# Patient Record
Sex: Female | Born: 1960 | Race: White | Hispanic: No | Smoking: Never smoker
Health system: Southern US, Community
[De-identification: ages and names within clinical notes are randomized; demographics above are authoritative.]

## PROBLEM LIST (undated history)

## (undated) DIAGNOSIS — E119 Type 2 diabetes mellitus without complications: Secondary | ICD-10-CM

## (undated) DIAGNOSIS — M5430 Sciatica, unspecified side: Secondary | ICD-10-CM

## (undated) DIAGNOSIS — N200 Calculus of kidney: Secondary | ICD-10-CM

## (undated) DIAGNOSIS — M549 Dorsalgia, unspecified: Secondary | ICD-10-CM

## (undated) DIAGNOSIS — G5600 Carpal tunnel syndrome, unspecified upper limb: Secondary | ICD-10-CM

## (undated) DIAGNOSIS — K08139 Complete loss of teeth due to caries, unspecified class: Secondary | ICD-10-CM

## (undated) DIAGNOSIS — I10 Essential (primary) hypertension: Secondary | ICD-10-CM

## (undated) DIAGNOSIS — K219 Gastro-esophageal reflux disease without esophagitis: Secondary | ICD-10-CM

## (undated) DIAGNOSIS — I4891 Unspecified atrial fibrillation: Secondary | ICD-10-CM

## (undated) DIAGNOSIS — K7460 Unspecified cirrhosis of liver: Secondary | ICD-10-CM

## (undated) DIAGNOSIS — E079 Disorder of thyroid, unspecified: Secondary | ICD-10-CM

## (undated) HISTORY — PX: TONSILLECTOMY: SUR1361

## (undated) HISTORY — PX: THYROIDECTOMY: SHX17

## (undated) HISTORY — PX: APPENDECTOMY: SHX54

## (undated) HISTORY — PX: TUBAL LIGATION: SHX77

## (undated) HISTORY — PX: BREAST SURGERY: SHX581

---

## 2010-09-21 ENCOUNTER — Emergency Department (HOSPITAL_BASED_OUTPATIENT_CLINIC_OR_DEPARTMENT_OTHER)
Admission: EM | Admit: 2010-09-21 | Discharge: 2010-09-21 | Disposition: A | Discharge status: Other Acute Inpatient Hospital | Payer: Self-pay | Source: Home / Self Care | Admitting: Emergency Medicine

## 2010-09-30 LAB — DIFFERENTIAL
Basophils Absolute: 0 10*3/uL (ref 0.0–0.1)
Basophils Relative: 0 % (ref 0–1)
Eosinophils Absolute: 0.4 10*3/uL (ref 0.0–0.7)
Eosinophils Relative: 5 % (ref 0–5)
Lymphocytes Relative: 30 % (ref 12–46)
Lymphs Abs: 2.2 10*3/uL (ref 0.7–4.0)
Monocytes Absolute: 0.7 10*3/uL (ref 0.1–1.0)
Monocytes Relative: 9 % (ref 3–12)
Neutro Abs: 4.2 10*3/uL (ref 1.7–7.7)
Neutrophils Relative %: 56 % (ref 43–77)

## 2010-09-30 LAB — CBC
HCT: 37.8 % (ref 36.0–46.0)
Hemoglobin: 13.3 g/dL (ref 12.0–15.0)
MCH: 28.4 pg (ref 26.0–34.0)
MCHC: 35.2 g/dL (ref 30.0–36.0)
MCV: 80.6 fL (ref 78.0–100.0)
Platelets: 178 10*3/uL (ref 150–400)
RBC: 4.69 MIL/uL (ref 3.87–5.11)
RDW: 13.2 % (ref 11.5–15.5)
WBC: 7.4 10*3/uL (ref 4.0–10.5)

## 2010-09-30 LAB — URINE CULTURE
Colony Count: NO GROWTH
Culture  Setup Time: 201201080028
Culture: NO GROWTH

## 2010-09-30 LAB — POCT CARDIAC MARKERS
CKMB, poc: 1.6 ng/mL (ref 1.0–8.0)
Myoglobin, poc: 90.2 ng/mL (ref 12–200)
Troponin i, poc: <0.05 ng/mL (ref 0.00–0.09)

## 2010-09-30 LAB — URINALYSIS, ROUTINE W REFLEX MICROSCOPIC
Bilirubin Urine: NEGATIVE
Hgb urine dipstick: NEGATIVE
Ketones, ur: NEGATIVE mg/dL
Nitrite: NEGATIVE
Protein, ur: NEGATIVE mg/dL
Specific Gravity, Urine: 1.015 (ref 1.005–1.030)
Urine Glucose, Fasting: NEGATIVE mg/dL
Urobilinogen, UA: 1 mg/dL (ref 0.0–1.0)
pH: 6.5 (ref 5.0–8.0)

## 2010-09-30 LAB — BASIC METABOLIC PANEL
BUN: 12 mg/dL (ref 6–23)
CO2: 25 mEq/L (ref 19–32)
Calcium: 9.3 mg/dL (ref 8.4–10.5)
Chloride: 107 mEq/L (ref 96–112)
Creatinine, Ser: 0.6 mg/dL (ref 0.4–1.2)
GFR calc Af Amer: >60 mL/min (ref >60)
GFR calc non Af Amer: >60 mL/min (ref >60)
Glucose, Bld: 98 mg/dL (ref 70–99)
Potassium: 4 mEq/L (ref 3.5–5.1)
Sodium: 146 mEq/L — ABNORMAL HIGH (ref 135–145)

## 2010-09-30 LAB — D-DIMER, QUANTITATIVE: D-Dimer, Quant: 0.24 ug/mL-FEU (ref 0.00–0.48)

## 2011-08-29 ENCOUNTER — Encounter: Payer: Self-pay | Admitting: *Deleted

## 2011-08-29 ENCOUNTER — Emergency Department (HOSPITAL_BASED_OUTPATIENT_CLINIC_OR_DEPARTMENT_OTHER)
Admission: EM | Admit: 2011-08-29 | Discharge: 2011-08-29 | Disposition: A | Discharge status: Home / Self Care | Payer: Medicaid Other | Attending: Emergency Medicine | Admitting: Emergency Medicine

## 2011-08-29 DIAGNOSIS — K219 Gastro-esophageal reflux disease without esophagitis: Secondary | ICD-10-CM | POA: Insufficient documentation

## 2011-08-29 DIAGNOSIS — I10 Essential (primary) hypertension: Secondary | ICD-10-CM | POA: Insufficient documentation

## 2011-08-29 DIAGNOSIS — I4891 Unspecified atrial fibrillation: Secondary | ICD-10-CM | POA: Insufficient documentation

## 2011-08-29 DIAGNOSIS — M79609 Pain in unspecified limb: Secondary | ICD-10-CM | POA: Insufficient documentation

## 2011-08-29 DIAGNOSIS — M79606 Pain in leg, unspecified: Secondary | ICD-10-CM

## 2011-08-29 HISTORY — DX: Gastro-esophageal reflux disease without esophagitis: K21.9

## 2011-08-29 HISTORY — DX: Dorsalgia, unspecified: M54.9

## 2011-08-29 HISTORY — DX: Unspecified atrial fibrillation: I48.91

## 2011-08-29 HISTORY — DX: Essential (primary) hypertension: I10

## 2011-08-29 MED ORDER — HYDROCODONE-ACETAMINOPHEN 5-325 MG PO TABS: ORAL_TABLET | ORAL | Status: DC

## 2011-08-29 MED ORDER — HYDROCODONE-ACETAMINOPHEN 5-325 MG PO TABS
2.0000 | ORAL_TABLET | Freq: Once | ORAL | Status: AC
  Administered 2011-08-29: 2 via ORAL
  Filled 2011-08-29: qty 2

## 2011-08-29 MED ORDER — PREDNISONE 20 MG PO TABS
40.0000 mg | ORAL_TABLET | Freq: Once | ORAL | Status: AC
  Administered 2011-08-29: 40 mg via ORAL
  Filled 2011-08-29: qty 2

## 2011-08-29 MED ORDER — PREDNISONE 20 MG PO TABS: 40.0000 mg | ORAL_TABLET | Freq: Every day | ORAL | Status: AC

## 2011-08-29 NOTE — ED Notes (Signed)
Pt c/o burning type pain in left lateral thigh that is tender to touch. No redness or swelling noted. Not warm to touch. Pt denies any known injury

## 2011-08-29 NOTE — Discharge Instructions (Signed)
 Narcotic and benzodiazepine use may cause drowsiness, slowed breathing or dependence.  Please use with caution and do not drive, operate machinery or watch young children alone while taking them.  Taking combinations of these medications or drinking alcohol will potentiate these effects.      Please call and follow up with your primary physician next week to make sure you are improving.  If pain suddenly worsens, you develop high fever, develop numbness or weakness worsening in your leg, or discoloration of your leg develops please return.

## 2011-08-29 NOTE — ED Notes (Signed)
Pt c/o left thigh pain x 2 days  

## 2011-08-29 NOTE — ED Provider Notes (Signed)
History     CSN: [id number] Arrival date & time: 08/29/2011  8:08 PM   First MD Initiated Contact with Patient 08/29/11 2108      Chief Complaint  Patient presents with  . Leg Pain    (Consider location/radiation/quality/duration/timing/severity/associated sxs/prior treatment) HPI Comments: Patient reports pain in the lateral thigh area of her left leg without any specific injury. She does take Coumadin. She has not noticed any significant swelling or bruising to that area. She denies any distal weakness or numbness. 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 She denies any foot drop, stumbling or weakness to her toes, ankle or foot. She does have Flexeril at home and has been taking it with limited improvement of her symptoms. She reports movement of her legs seems to make it worse lately. Otherwise she cannot specify any specific activity that makes the pain worse. She reports holding pressure to her thigh seems to improve the pain somewhat. She denies any fever or chills. She denies any chest pain or shortness of breath or pleurisy. No recent long distance travel. She denies any calf swelling or tenderness.  The history is provided by the patient.    Past Medical History  Diagnosis Date  . Atrial fibrillation   . Hypertension   . Back pain   . GERD (gastroesophageal reflux disease)     Past Surgical History  Procedure Date  . Cesarean section   . Tonsillectomy   . Appendectomy   . Breast surgery     History reviewed. No pertinent family history.  History  Substance Use Topics  . Smoking status: Never Smoker   . Smokeless tobacco: Not on file  . Alcohol Use: No    OB History    Grav Para Term Preterm Abortions TAB SAB Ect Mult Living                  Review of Systems  Constitutional: Negative.   HENT: Negative for neck pain.   Respiratory: Negative for cough and shortness of  breath.   Cardiovascular: Negative for chest pain.  Musculoskeletal: Negative for back pain and joint swelling.  Skin: Negative for color change, rash and wound.  Neurological: Negative for weakness and numbness.    Allergies  Motrin and Ultram  Home Medications   Current Outpatient Rx  Name Route Sig Dispense Refill  . CYCLOBENZAPRINE HCL 10 MG PO TABS Oral Take 10 mg by mouth 3 (three) times daily as needed. For muscle spasms     . DIGOXIN 0.25 MG PO TABS Oral Take 250 mcg by mouth daily.      Marland Kitchen ESOMEPRAZOLE MAGNESIUM 40 MG PO CPDR Oral Take 40 mg by mouth daily before breakfast.      . METOPROLOL SUCCINATE ER 50 MG PO TB24 Oral Take 50 mg by mouth 2 (two) times daily.      . WARFARIN SODIUM 5 MG PO TABS Oral Take 2.5-5 mg by mouth daily. Take 1/2 tab Monday and Friday and take 1 tab on Tuesday, Wednesday, Thursday, Saturday and Sunday     . HYDROCODONE-ACETAMINOPHEN 5-325 MG PO TABS  1-2 tablets po q 6 hours prn moderate or severe pain 20 tablet 0  . PREDNISONE 20 MG PO TABS Oral Take 2 tablets (40 mg total) by mouth daily. 10 tablet 0    BP 141/81  Pulse 102  Temp(Src) 97.8 F (36.6  C) (Oral)  Resp 16  Ht 5' (1.524 m)  Wt 212 lb (96.163 kg)  BMI 41.40 kg/m2  SpO2 100%  Physical Exam  Nursing note and vitals reviewed. Constitutional: She is oriented to person, place, and time. She appears well-developed and well-nourished.  HENT:  Head: Normocephalic and atraumatic.  Neck: Normal range of motion. Neck supple.  Cardiovascular: Normal rate and regular rhythm.   Abdominal: She exhibits no distension. There is no tenderness. There is no rebound and no guarding.  Neurological: She is alert and oriented to person, place, and time. She has normal strength. She displays no atrophy. No sensory deficit. She exhibits normal muscle tone.  Skin: No abrasion, no bruising, no ecchymosis and no rash noted. No erythema.    ED Course  Procedures (including critical care time)  Labs  Reviewed - No data to display No results found.   1. Leg pain, lateral       MDM    Patient has full range of motion, normal DP and PT pulses of the left leg. She has no significant asymmetric edema or swelling. She has no obvious lesions, rash to her left thigh area. She does not have a fever here on exam and her vital signs are otherwise within normal limits. She denies any pleuritic pain. She is a negative Homans sign on the left side. I suspect given the patient's history of a bulging disc, she may have somewhat of a radicular discomfort in that area. She has normal distal reflexes and strength. Patient is told to take analgesics, continue her Flexeril, now had prednisone for inflammation. She is told to followup with her primary care physician for close recheck to ensure that symptoms are resolving. Otherwise she is told that she may need referral to a back specialist if things are not improving.        Gavin Pound. Oletta Lamas, MD 08/29/11 2153

## 2011-10-28 ENCOUNTER — Encounter (HOSPITAL_BASED_OUTPATIENT_CLINIC_OR_DEPARTMENT_OTHER): Payer: Self-pay | Admitting: *Deleted

## 2011-10-28 ENCOUNTER — Emergency Department (HOSPITAL_BASED_OUTPATIENT_CLINIC_OR_DEPARTMENT_OTHER)
Admission: EM | Admit: 2011-10-28 | Discharge: 2011-10-28 | Disposition: A | Discharge status: Home / Self Care | Payer: Medicaid Other | Attending: Emergency Medicine | Admitting: Emergency Medicine

## 2011-10-28 ENCOUNTER — Emergency Department (INDEPENDENT_AMBULATORY_CARE_PROVIDER_SITE_OTHER): Payer: Medicaid Other

## 2011-10-28 DIAGNOSIS — K219 Gastro-esophageal reflux disease without esophagitis: Secondary | ICD-10-CM | POA: Insufficient documentation

## 2011-10-28 DIAGNOSIS — Z79899 Other long term (current) drug therapy: Secondary | ICD-10-CM | POA: Insufficient documentation

## 2011-10-28 DIAGNOSIS — I10 Essential (primary) hypertension: Secondary | ICD-10-CM | POA: Insufficient documentation

## 2011-10-28 DIAGNOSIS — M25462 Effusion, left knee: Secondary | ICD-10-CM

## 2011-10-28 DIAGNOSIS — W19XXXA Unspecified fall, initial encounter: Secondary | ICD-10-CM

## 2011-10-28 DIAGNOSIS — M25569 Pain in unspecified knee: Secondary | ICD-10-CM | POA: Insufficient documentation

## 2011-10-28 DIAGNOSIS — M25469 Effusion, unspecified knee: Secondary | ICD-10-CM | POA: Insufficient documentation

## 2011-10-28 DIAGNOSIS — I4891 Unspecified atrial fibrillation: Secondary | ICD-10-CM | POA: Insufficient documentation

## 2011-10-28 LAB — GLUCOSE, CAPILLARY: Glucose-Capillary: 177 mg/dL — ABNORMAL HIGH (ref 70–99)

## 2011-10-28 MED ORDER — HYDROCODONE-ACETAMINOPHEN 5-325 MG PO TABS: 2.0000 | ORAL_TABLET | ORAL | Status: AC | PRN

## 2011-10-28 NOTE — ED Notes (Signed)
Pt c/o right knee pain after fall x 1 day

## 2011-10-28 NOTE — Discharge Instructions (Signed)
Knee Effusion °The medical term for having fluid in your knee is effusion. This is often due to an internal derangement of the knee. This means something is wrong inside the knee. Some of the causes of fluid in the knee may be torn cartilage, a torn ligament, or bleeding into the joint from an injury. Your knee is likely more difficult to bend and move. This is often because there is increased pain and pressure in the joint. The time it takes for recovery from a knee effusion depends on different factors, including:  °· Type of injury.  °· Your age.  °· Physical and medical conditions.  °· Rehabilitation Strategies.  °How long you will be away from your normal activities will depend on what kind of knee problem you have and how much damage is present. Your knee has two types of cartilage. Articular cartilage covers the bone ends and lets your knee bend and move smoothly. Two menisci, thick pads of cartilage that form a rim inside the joint, help absorb shock and stabilize your knee. Ligaments bind the bones together and support your knee joint. Muscles move the joint, help support your knee, and take stress off the joint itself. °CAUSES  °Often an effusion in the knee is caused by an injury to one of the menisci. This is often a tear in the cartilage. Recovery after a meniscus injury depends on how much meniscus is damaged and whether you have damaged other knee tissue. Small tears may heal on their own with conservative treatment. Conservative means rest, limited weight bearing activity and muscle strengthening exercises. Your recovery may take up to 6 weeks.  °TREATMENT  °Larger tears may require surgery. Meniscus injuries may be treated during arthroscopy. Arthroscopy is a procedure in which your surgeon uses a small telescope like instrument to look in your knee. Your caregiver can make a more accurate diagnosis (learning what is wrong) by performing an arthroscopic procedure. °If your injury is on the inner  margin of the meniscus, your surgeon may trim the meniscus back to a smooth rim. In other cases your surgeon will try to repair a damaged meniscus with stitches (sutures). This may make rehabilitation take longer, but may provide better long term result by helping your knee keep its shock absorption capabilities. °Ligaments which are completely torn usually require surgery for repair. °HOME CARE INSTRUCTIONS °· Use crutches as instructed.  °· If a brace is applied, use as directed.  °· Once you are home, an ice pack applied to your swollen knee may help with discomfort and help decrease swelling.  °· Keep your knee raised (elevated) when you are not up and around or on crutches.  °· Only take over-the-counter or prescription medicines for pain, discomfort, or fever as directed by your caregiver.  °· Your caregivers will help with instructions for rehabilitation of your knee. This often includes strengthening exercises.  °· You may resume a normal diet and activities as directed.  °SEEK MEDICAL CARE IF:  °· There is increased swelling in your knee.  °· You notice redness, swelling, or increasing pain in your knee.  °· An unexplained oral temperature above 102° F (38.9° C) develops.  °SEEK IMMEDIATE MEDICAL CARE IF:  °· You develop a rash.  °· You have difficulty breathing.  °· You have any allergic reactions from medications you may have been given.  °· There is severe pain with any motion of the knee.  °MAKE SURE YOU:  °· Understand these instructions.  °·   Will watch your condition.  °· Will get help right away if you are not doing well or get worse.  °Document Released: 11/22/2003 Document Revised: 05/14/2011 Document Reviewed: 01/26/2008 °[employer]® Patient Information ©2012 [employer]. °

## 2011-10-28 NOTE — ED Provider Notes (Signed)
Medical screening examination/treatment/procedure(s) were performed by non-physician practitioner and as supervising physician I was immediately available for consultation/collaboration.  Ethelda Chick, MD 10/28/11 [phone]

## 2011-10-28 NOTE — ED Provider Notes (Signed)
History     CSN: [id number]  Arrival date & time 10/28/11  6962   First MD Initiated Contact with Patient 10/28/11 2001      Chief Complaint  Patient presents with  . Knee Pain    (Consider location/radiation/quality/duration/timing/severity/associated sxs/prior treatment) Patient is a 51 y.o. female presenting with knee pain. The history is provided by the patient. No language interpreter was used.  Knee Pain This is a new problem. The current episode started yesterday. The problem occurs constantly. The problem has been gradually worsening. Associated symptoms include joint swelling. The symptoms are aggravated by bending. She has tried nothing for the symptoms. The treatment provided moderate relief.  Pt complains of a pain in right knee after falling yesterday.  Pt reports knee is swollen  Past Medical History  Diagnosis Date  . Atrial fibrillation   . Hypertension   . Back pain   . GERD (gastroesophageal reflux disease)     Past Surgical History  Procedure Date  . Cesarean section   . Tonsillectomy   . Appendectomy   . Breast surgery     History reviewed. No pertinent family history.  History  Substance Use Topics  . Smoking status: Never Smoker   . Smokeless tobacco: Not on file  . Alcohol Use: No    OB History    Grav Para Term Preterm Abortions TAB SAB Ect Mult Living                  Review of Systems  Musculoskeletal: Positive for joint swelling.  All other systems reviewed and are negative.    Allergies  Motrin and Ultram  Home Medications   Current Outpatient Rx  Name Route Sig Dispense Refill  . CALCIUM CARBONATE-VITAMIN D 600-400 MG-UNIT PO TABS Oral Take 2 tablets by mouth 2 (two) times daily.    . CYCLOBENZAPRINE HCL 10 MG PO TABS Oral Take 10 mg by mouth 3 (three) times daily as needed. For muscle spasms     . DIGOXIN 0.25 MG PO TABS Oral Take 250 mcg by mouth daily.      Marland Kitchen ESOMEPRAZOLE MAGNESIUM 40 MG PO CPDR Oral Take 40 mg by  mouth daily before breakfast.      . GABAPENTIN 300 MG PO CAPS Oral Take 900 mg by mouth 2 (two) times daily.    Marland Kitchen HYDROCODONE-ACETAMINOPHEN 5-325 MG PO TABS Oral Take 1-2 tablets by mouth every 6 (six) hours as needed. moderate or severe pain    . LEVOTHYROXINE SODIUM 200 MCG PO TABS Oral Take 200 mcg by mouth daily.    Marland Kitchen LISINOPRIL 5 MG PO TABS Oral Take 5 mg by mouth daily.    Marland Kitchen METOPROLOL SUCCINATE ER 50 MG PO TB24 Oral Take 50 mg by mouth 2 (two) times daily.      . WARFARIN SODIUM 5 MG PO TABS Oral Take 2.5-5 mg by mouth daily. Take 1/2 tab Monday and Friday and take 1 tab on Tuesday, Wednesday, Thursday, Saturday and Sunday       BP 133/80  Pulse 100  Temp(Src) 98.1 F (36.7 C) (Oral)  Resp 16  Ht 4\' 11"  (1.499 m)  Wt 280 lb (127.007 kg)  BMI 56.55 kg/m2  SpO2 100%  Physical Exam  Nursing note and vitals reviewed. Constitutional: She appears well-developed and well-nourished.  HENT:  Head: Normocephalic and atraumatic.  Musculoskeletal: She exhibits edema and tenderness.       Swollen right knee,  Small effusion vs bruising  Decreased  range of motion,  Ns and nv intact  Skin: Skin is warm and dry.    ED Course  Procedures (including critical care time)  Labs Reviewed  GLUCOSE, CAPILLARY - Abnormal; Notable for the following:    Glucose-Capillary 177 (*)    All other components within normal limits   Dg Knee Complete 4 Views Right  10/28/2011  *RADIOLOGY REPORT*  Clinical Data: Larey Seat yesterday with knee pain  RIGHT KNEE - COMPLETE 4+ VIEW  Comparison: None.  Findings: The knee joint spaces appear normal.  No fracture is seen.  No effusion is noted.  IMPRESSION: Negative.  Original Report Authenticated By: Juline Patch, M.D.     No diagnosis found.    MDM  Pt placed in knee immbolizer,  I advised follow up with Dr. Pearletha Forge for recheck.  Pt advised to see her Md for recheck of elevated glucose.        Langston Masker, [place] 10/28/11 2132

## 2011-10-28 NOTE — ED Notes (Signed)
Ambulatory to room without difficulty or limp

## 2011-11-13 ENCOUNTER — Emergency Department (INDEPENDENT_AMBULATORY_CARE_PROVIDER_SITE_OTHER): Payer: Medicaid Other

## 2011-11-13 ENCOUNTER — Encounter (HOSPITAL_BASED_OUTPATIENT_CLINIC_OR_DEPARTMENT_OTHER): Payer: Self-pay | Admitting: Emergency Medicine

## 2011-11-13 ENCOUNTER — Emergency Department (HOSPITAL_BASED_OUTPATIENT_CLINIC_OR_DEPARTMENT_OTHER)
Admission: EM | Admit: 2011-11-13 | Discharge: 2011-11-14 | Disposition: A | Discharge status: Home / Self Care | Payer: Medicaid Other | Attending: Emergency Medicine | Admitting: Emergency Medicine

## 2011-11-13 DIAGNOSIS — W2209XA Striking against other stationary object, initial encounter: Secondary | ICD-10-CM

## 2011-11-13 DIAGNOSIS — M79609 Pain in unspecified limb: Secondary | ICD-10-CM | POA: Insufficient documentation

## 2011-11-13 DIAGNOSIS — IMO0002 Reserved for concepts with insufficient information to code with codable children: Secondary | ICD-10-CM | POA: Insufficient documentation

## 2011-11-13 DIAGNOSIS — S6990XA Unspecified injury of unspecified wrist, hand and finger(s), initial encounter: Secondary | ICD-10-CM | POA: Insufficient documentation

## 2011-11-13 DIAGNOSIS — I10 Essential (primary) hypertension: Secondary | ICD-10-CM | POA: Insufficient documentation

## 2011-11-13 DIAGNOSIS — S6000XA Contusion of unspecified finger without damage to nail, initial encounter: Secondary | ICD-10-CM | POA: Insufficient documentation

## 2011-11-13 DIAGNOSIS — K219 Gastro-esophageal reflux disease without esophagitis: Secondary | ICD-10-CM | POA: Insufficient documentation

## 2011-11-13 DIAGNOSIS — I4891 Unspecified atrial fibrillation: Secondary | ICD-10-CM | POA: Insufficient documentation

## 2011-11-13 DIAGNOSIS — Z7901 Long term (current) use of anticoagulants: Secondary | ICD-10-CM | POA: Insufficient documentation

## 2011-11-13 DIAGNOSIS — Z79899 Other long term (current) drug therapy: Secondary | ICD-10-CM | POA: Insufficient documentation

## 2011-11-13 HISTORY — DX: Disorder of thyroid, unspecified: E07.9

## 2011-11-13 NOTE — ED Notes (Signed)
Pt c/o left pinky finger pain and pain in lateral side of left hand after jamming finger last night.

## 2011-11-14 NOTE — Discharge Instructions (Signed)
May wear splint for comfort/support/protection of area for the next few days. Take tylenol as need. Follow up with primary care doctor in 1 week if symptoms fail to improve/resolve. Return to ER if worse, new symptoms, other concern.    Finger Sprain Your exam shows you have a sprained or jammed finger joint. Most of the time, these minor joint injuries will heal in 1-2 weeks. Treatment may include a finger splint, or taping the injured finger to the one next to it to reduce movement and provide support. Keep the injured finger elevated for the next 2-3 days as needed to reduce swelling. Ice packs applied every 2-4 hours for 20-30 minutes will also help reduce the pain and swelling. Do not leave your sprained finger unprotected until the pain and stiffness in the injured joint are much improved. Rings on the injured finger need to be removed to lessen the swelling and improve circulation. Call your doctor for a follow-up exam if your finger is not much improved after one week, or if you have any deformity or persistent limitation of movement. Document Released: 10/09/2004 Document Revised: 10/04/2010 Document Reviewed: 09/01/2005 [facility] Patient Information 2012 [place], [place].  Contusion A bruise (contusion) or hematoma is a collection of blood under skin causing an area of discoloration. It is caused by an injury to blood vessels beneath the injured area with a release of blood into that area. As blood accumulates it is known as a hematoma. This collection of blood causes a blue to dark blue color. As the injury improves over days to weeks it turns to a yellowish color and then usually disappears completely over the same period of time. These generally resolve completely without problems. The hematoma rarely requires drainage. HOME CARE INSTRUCTIONS   Apply ice to the injured area for 15 to 20 minutes 3 to 4 times per day for the first 1 or 2 days.   Put the ice in a plastic bag and place a towel  between the bag of ice and your skin. Discontinue the ice if it causes pain.   If bleeding is more than just a little, apply pressure to the area for at least thirty minutes to decrease the amount of bruising. Apply pressure and ice as your caregiver suggests.   If the injury is on an extremity, elevation of that part may help to decrease pain and swelling. Wrapping with an ace or supportive wrap may also be helpful. If the bruise is on a lower extremity and is painful, crutches may be helpful for a couple days.   If you have been given a tetanus shot because the skin was broken, your arm may get swollen, red and warm to touch at the shot site. This is a normal response to the medicine in the shot. If you did not receive a tetanus shot today because you did not recall when your last one was given, make sure to check with your caregiver's office and determine if one is needed. Generally for a "dirty" wound, you should receive a tetanus booster if you have not had one in the last five years. If you have a "clean" wound, you should receive a tetanus booster if you have not had one within the last ten years.  SEEK MEDICAL CARE IF:   You have pain not controlled with over the counter medications. Only take over-the-counter or prescription medicines for pain, discomfort, or fever as directed by your caregiver. Do not use aspirin as it may cause bleeding.  You develop increasing pain or swelling in the area of injury.   You develop any problems which seem worse than the problems which brought you in.  SEEK IMMEDIATE MEDICAL CARE IF:   You have a fever.   You develop severe pain in the area of the bruise out of proportion to the initial injury.   The bruised area becomes red, tender, and swollen.  MAKE SURE YOU:   Understand these instructions.   Will watch your condition.   Will get help right away if you are not doing well or get worse.  Document Released: 06/11/2005 Document Revised:  05/14/2011 Document Reviewed: 04/19/2008 [facility] Patient Information 2012 [place], [place].

## 2011-11-14 NOTE — ED Provider Notes (Signed)
History     CSN: [id number]  Arrival date & time 11/13/11  2204   First MD Initiated Contact with Patient 11/14/11 0019      Chief Complaint  Patient presents with  . Finger Injury    (Consider location/radiation/quality/duration/timing/severity/associated sxs/prior treatment) The history is provided by the patient.  pt states accidentally jammed left small finger in washing machine door last night. Constant dull pain to small finger. Skin intact. No numbness. No other injury. Worse w palpation.   Past Medical History  Diagnosis Date  . Atrial fibrillation   . Hypertension   . Back pain   . GERD (gastroesophageal reflux disease)   . Thyroid disease     Past Surgical History  Procedure Date  . Cesarean section   . Tonsillectomy   . Appendectomy   . Breast surgery   . Thyroidectomy     No family history on file.  History  Substance Use Topics  . Smoking status: Never Smoker   . Smokeless tobacco: Not on file  . Alcohol Use: No    OB History    Grav Para Term Preterm Abortions TAB SAB Ect Mult Living                  Review of Systems  Constitutional: Negative for fever.  Skin: Negative for wound.  Neurological: Negative for numbness.    Allergies  Motrin and Ultram  Home Medications   Current Outpatient Rx  Name Route Sig Dispense Refill  . CALCIUM CARBONATE-VITAMIN D 600-400 MG-UNIT PO TABS Oral Take 2 tablets by mouth 2 (two) times daily.    . CYCLOBENZAPRINE HCL 10 MG PO TABS Oral Take 10 mg by mouth 3 (three) times daily as needed. For muscle spasms     . DIGOXIN 0.25 MG PO TABS Oral Take 250 mcg by mouth daily.      Marland Kitchen ESOMEPRAZOLE MAGNESIUM 40 MG PO CPDR Oral Take 40 mg by mouth daily before breakfast.      . GABAPENTIN 300 MG PO CAPS Oral Take 900 mg by mouth 2 (two) times daily.    Marland Kitchen HYDROCODONE-ACETAMINOPHEN 5-325 MG PO TABS Oral Take 1-2 tablets by mouth every 6 (six) hours as needed. moderate or severe pain    . LEVOTHYROXINE SODIUM 200  MCG PO TABS Oral Take 200 mcg by mouth daily.    Marland Kitchen LISINOPRIL 5 MG PO TABS Oral Take 5 mg by mouth daily.    Marland Kitchen METOPROLOL SUCCINATE ER 50 MG PO TB24 Oral Take 50 mg by mouth 2 (two) times daily.      . WARFARIN SODIUM 5 MG PO TABS Oral Take 2.5-5 mg by mouth daily. Take 1/2 tab Monday and Friday and take 1 tab on Tuesday, Wednesday, Thursday, Saturday and Sunday       BP 137/60  Pulse 79  Temp(Src) 97.9 F (36.6 C) (Oral)  Resp 18  Wt 200 lb (90.719 kg)  SpO2 98%  Physical Exam  Nursing note and vitals reviewed. Constitutional: She appears well-developed and well-nourished. No distress.  HENT:  Head: Atraumatic.  Eyes: Conjunctivae are normal. No scleral icterus.  Neck: Neck supple. No tracheal deviation present.  Cardiovascular: Normal rate.   Pulmonary/Chest: Effort normal. No respiratory distress.  Abdominal: Normal appearance.  Musculoskeletal: She exhibits no edema.       Tenderness left small finger. Normal alignment. Normal cap refill distally. Tenderness left small finger proximally. No other focal bony tenderness. No instability.   Neurological: She is alert.  Skin: Skin is warm and dry. No rash noted.  Psychiatric: She has a normal mood and affect.    ED Course  Procedures (including critical care time)  Labs Reviewed - No data to display Dg Hand Complete Left  11/13/2011  *RADIOLOGY REPORT*  Clinical Data: Left hand jammed in washing machine; pain at the left fifth finger.  LEFT HAND - COMPLETE 3+ VIEW  Comparison: None.  Findings: There is no evidence of fracture or dislocation.  The left fifth finger appears intact.  The joint spaces are preserved; the soft tissues are unremarkable in appearance.  The carpal rows are intact, and demonstrate normal alignment.  IMPRESSION: No evidence of fracture or dislocation.  Original Report Authenticated By: Tonia Ghent, M.D.       MDM  Finger splint. xrays neg. Discussed w pt.         Suzi Roots, MD 11/14/11  [phone]

## 2011-12-05 ENCOUNTER — Encounter (HOSPITAL_BASED_OUTPATIENT_CLINIC_OR_DEPARTMENT_OTHER): Payer: Self-pay

## 2011-12-05 ENCOUNTER — Emergency Department (HOSPITAL_BASED_OUTPATIENT_CLINIC_OR_DEPARTMENT_OTHER)
Admission: EM | Admit: 2011-12-05 | Discharge: 2011-12-05 | Disposition: A | Discharge status: Home / Self Care | Payer: Medicaid Other | Attending: Emergency Medicine | Admitting: Emergency Medicine

## 2011-12-05 ENCOUNTER — Emergency Department (INDEPENDENT_AMBULATORY_CARE_PROVIDER_SITE_OTHER): Payer: Medicaid Other

## 2011-12-05 DIAGNOSIS — D259 Leiomyoma of uterus, unspecified: Secondary | ICD-10-CM | POA: Insufficient documentation

## 2011-12-05 DIAGNOSIS — R109 Unspecified abdominal pain: Secondary | ICD-10-CM | POA: Insufficient documentation

## 2011-12-05 DIAGNOSIS — K429 Umbilical hernia without obstruction or gangrene: Secondary | ICD-10-CM | POA: Insufficient documentation

## 2011-12-05 DIAGNOSIS — I10 Essential (primary) hypertension: Secondary | ICD-10-CM | POA: Insufficient documentation

## 2011-12-05 DIAGNOSIS — I728 Aneurysm of other specified arteries: Secondary | ICD-10-CM | POA: Insufficient documentation

## 2011-12-05 DIAGNOSIS — K219 Gastro-esophageal reflux disease without esophagitis: Secondary | ICD-10-CM | POA: Insufficient documentation

## 2011-12-05 DIAGNOSIS — I4891 Unspecified atrial fibrillation: Secondary | ICD-10-CM | POA: Insufficient documentation

## 2011-12-05 DIAGNOSIS — Z79899 Other long term (current) drug therapy: Secondary | ICD-10-CM | POA: Insufficient documentation

## 2011-12-05 DIAGNOSIS — N200 Calculus of kidney: Secondary | ICD-10-CM

## 2011-12-05 DIAGNOSIS — N23 Unspecified renal colic: Secondary | ICD-10-CM | POA: Insufficient documentation

## 2011-12-05 DIAGNOSIS — E079 Disorder of thyroid, unspecified: Secondary | ICD-10-CM | POA: Insufficient documentation

## 2011-12-05 HISTORY — DX: Calculus of kidney: N20.0

## 2011-12-05 LAB — URINALYSIS, ROUTINE W REFLEX MICROSCOPIC
Bilirubin Urine: NEGATIVE
Glucose, UA: NEGATIVE mg/dL
Hgb urine dipstick: NEGATIVE
Ketones, ur: NEGATIVE mg/dL
Leukocytes, UA: NEGATIVE
Nitrite: NEGATIVE
Protein, ur: NEGATIVE mg/dL
Specific Gravity, Urine: 1.015 (ref 1.005–1.030)
Urobilinogen, UA: 1 mg/dL (ref 0.0–1.0)
pH: 7 (ref 5.0–8.0)

## 2011-12-05 MED ORDER — MORPHINE SULFATE 4 MG/ML IJ SOLN
4.0000 mg | Freq: Once | INTRAMUSCULAR | Status: AC
  Administered 2011-12-05: 4 mg via INTRAVENOUS
  Filled 2011-12-05: qty 1

## 2011-12-05 MED ORDER — ONDANSETRON HCL 4 MG/2ML IJ SOLN
4.0000 mg | Freq: Once | INTRAMUSCULAR | Status: AC
  Administered 2011-12-05: 4 mg via INTRAVENOUS
  Filled 2011-12-05: qty 2

## 2011-12-05 NOTE — Discharge Instructions (Signed)
Ureteral Colic  Ureteral colic is spasm-like pain from the kidney or the ureter. This is often caused by a kidney stone. The pain is caused by the stone trying to get through the tubes that pass your pee.  HOME CARE    Drink enough fluids to keep your pee (urine) clear or pale yellow.   Strain all your pee. A strainer will be provided. Keep anything caught in the strainer and bring it to your doctor. The stone causing the pain may be very small.   Only take medicine as told by your doctor.   Follow up with your doctor as told.  GET HELP RIGHT AWAY IF:    Pain is not controlled with medicine.   Pain continues or gets worse.   The pain changes and there is chest or belly (abdominal) pain.   You pass out (faint).   You cannot pee.   You keep throwing up (vomiting).   You have a temperature by mouth above 102 F (38.9 C), not controlled by medicine.  MAKE SURE YOU:    Understand these instructions.   Will watch this condition.   Will get help right away if you are not doing well or get worse.  Document Released: 02/18/2008 Document Revised: 08/21/2011 Document Reviewed: 02/18/2008  [employer] Patient Information 2012 [employer].

## 2011-12-05 NOTE — ED Provider Notes (Addendum)
History     CSN: [id number]  Arrival date & time 12/05/11  1743   First MD Initiated Contact with Patient 12/05/11 1759      Chief Complaint  Patient presents with  . Flank Pain    (Consider location/radiation/quality/duration/timing/severity/associated sxs/prior treatment) HPI Comments: Pt presents with 2 day hx of pain to right flank area, sudden onset.  Waxing and waning since then.  Somewhat worse with movement.  Not worse with breathing.  Mild nausea.  No vomiting or diarrhea.  No fevers.  No urinary difficulties or gross hematuia.  The history is provided by the patient.    Past Medical History  Diagnosis Date  . Atrial fibrillation   . Hypertension   . Back pain   . GERD (gastroesophageal reflux disease)   . Thyroid disease   . Kidney stone     Past Surgical History  Procedure Date  . Cesarean section   . Tonsillectomy   . Appendectomy   . Breast surgery   . Thyroidectomy   . Cesarean section     No family history on file.  History  Substance Use Topics  . Smoking status: Never Smoker   . Smokeless tobacco: Not on file  . Alcohol Use: No    OB History    Grav Para Term Preterm Abortions TAB SAB Ect Mult Living                  Review of Systems  Constitutional: Negative for fever, chills, diaphoresis and fatigue.  HENT: Negative for congestion, rhinorrhea and sneezing.   Eyes: Negative.   Respiratory: Negative for cough, chest tightness and shortness of breath.   Cardiovascular: Negative for chest pain and leg swelling.  Gastrointestinal: Positive for nausea. Negative for vomiting, abdominal pain, diarrhea and blood in stool.  Genitourinary: Positive for flank pain. Negative for frequency, hematuria and difficulty urinating.  Musculoskeletal: Positive for back pain. Negative for arthralgias.  Skin: Negative for rash.  Neurological: Negative for dizziness, speech difficulty, weakness, numbness and headaches.    Allergies  Motrin and  Ultram  Home Medications   Current Outpatient Rx  Name Route Sig Dispense Refill  . CALCIUM CARBONATE-VITAMIN D 600-400 MG-UNIT PO TABS Oral Take 2 tablets by mouth 2 (two) times daily.    . CYCLOBENZAPRINE HCL 10 MG PO TABS Oral Take 10 mg by mouth 3 (three) times daily as needed. For muscle spasms     . DIGOXIN 0.25 MG PO TABS Oral Take 250 mcg by mouth daily.      Marland Kitchen ESOMEPRAZOLE MAGNESIUM 40 MG PO CPDR Oral Take 40 mg by mouth daily before breakfast.      . GABAPENTIN 300 MG PO CAPS Oral Take 900 mg by mouth 2 (two) times daily.    Marland Kitchen HYDROCODONE-ACETAMINOPHEN 5-325 MG PO TABS Oral Take 1-2 tablets by mouth every 6 (six) hours as needed. moderate or severe pain    . LEVOTHYROXINE SODIUM 200 MCG PO TABS Oral Take 200 mcg by mouth daily.    Marland Kitchen LISINOPRIL 5 MG PO TABS Oral Take 5 mg by mouth daily.    Marland Kitchen METOPROLOL SUCCINATE ER 50 MG PO TB24 Oral Take 50 mg by mouth 2 (two) times daily.      . WARFARIN SODIUM 5 MG PO TABS Oral Take 2.5-5 mg by mouth daily. Take 1/2 tab Monday and Friday and take 1 tab on Tuesday, Wednesday, Thursday, Saturday and Sunday      BP 125/80  Temp 98 F (  36.7 C)  Resp 20  Ht 4\' 11"  (1.499 m)  Wt 200 lb (90.719 kg)  BMI 40.39 kg/m2  SpO2 98%  Physical Exam  Constitutional: She is oriented to person, place, and time. She appears well-developed and well-nourished.  HENT:  Head: Normocephalic and atraumatic.  Eyes: Pupils are equal, round, and reactive to light.  Neck: Normal range of motion. Neck supple.  Cardiovascular: Normal rate, regular rhythm and normal heart sounds.   Pulmonary/Chest: Effort normal and breath sounds normal. No respiratory distress. She has no wheezes. She has no rales. She exhibits no tenderness.  Abdominal: Soft. Bowel sounds are normal. There is tenderness. There is no rebound and no guarding.       Mild tenderness to right flank/mid back area  Musculoskeletal: Normal range of motion. She exhibits edema. She exhibits no tenderness.   Lymphadenopathy:    She has no cervical adenopathy.  Neurological: She is alert and oriented to person, place, and time.  Skin: Skin is warm and dry. No rash noted.  Psychiatric: She has a normal mood and affect.    ED Course  Procedures (including critical care time)  Results for orders placed during the hospital encounter of 12/05/11  URINALYSIS, ROUTINE W REFLEX MICROSCOPIC      Component Value Range   Color, Urine YELLOW  YELLOW    APPearance CLOUDY (*) CLEAR    Specific Gravity, Urine 1.015  1.005 - 1.030    pH 7.0  5.0 - 8.0    Glucose, UA NEGATIVE  NEGATIVE (mg/dL)   Hgb urine dipstick NEGATIVE  NEGATIVE    Bilirubin Urine NEGATIVE  NEGATIVE    Ketones, ur NEGATIVE  NEGATIVE (mg/dL)   Protein, ur NEGATIVE  NEGATIVE (mg/dL)   Urobilinogen, UA 1.0  0.0 - 1.0 (mg/dL)   Nitrite NEGATIVE  NEGATIVE    Leukocytes, UA NEGATIVE  NEGATIVE    Ct Abdomen Pelvis Wo Contrast  12/05/2011  *RADIOLOGY REPORT*  Clinical Data: Right flank pain.  Dysuria.  Nausea.  History of urinary tract calculi.  Surgical history includes cesarean section and appendectomy.  CT ABDOMEN AND PELVIS WITHOUT CONTRAST 12/05/2011:  Technique:  Multidetector CT imaging of the abdomen and pelvis was performed following the standard protocol without intravenous contrast.  Comparison: Unenhanced CT abdomen and pelvis 09/21/2010 [facility].  Findings: Tiny non-obstructing single calculus in the each kidney as noted on the prior examination, that on the right visualized best on the sagittal reconstructed images.  No obstructing ureteral calculi on either side.  No evidence of hydronephrosis involving either kidney.  Small cortical cysts involving both kidneys. Within the limits of the unenhanced technique, no significant focal parenchymal abnormality involving either kidney.  Normal unenhanced appearance of the liver, spleen, pancreas, and adrenal glands.  Gallbladder unremarkable by CT.  No biliary ductal dilation.   No visible aorto-iliofemoral atherosclerosis. 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 No significant lymphadenopathy.  Stomach filled with food and normal in appearance.  Normal- appearing small bowel and colon.  Surgical clips in the right upper pelvis from the prior appendectomy.  No ascites.  Umbilical hernia containing fat.  Multiple uterine fibroids as noted previously, at least two of which are calcified and degenerated.  Normal-appearing ovaries by CT.  No adnexal masses or free pelvic fluid.  Urinary bladder unremarkable.  Phleboliths low in the right side of the pelvis. Bone window images demonstrate mild lower thoracic and lumbar spine spondylosis and  a chronic disc extrusion at L5-S1 with calcification the posterior annular fibers.  Visualized lung bases clear.  IMPRESSION:  1.  No evidence of urinary tract obstruction on either side. 2.  Tiny calculus in a posterior mid calix of the left kidney and an anterior lower pole calix of the right kidney. 3.  No acute abnormalities involving the abdomen pelvis. 4.  Uterine fibroids. 5.  Umbilical hernia containing fat. 6.  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  Original Report Authenticated By: Arnell Sieving, M.D.       1. Renal colic   2. Splenic artery aneurysm       MDM  Pt with right flank pain.  No evidence of ureteral stone/infection.  Pt in no evident pain/distress now.  Has appt to f/u with her urologist, Dr. Dawna Part in [place].  Will not prescribe pain meds, as she is in pain clinic, just got 90 tabs of hydrocodone 10mg  tabs on 11/27/11.  Notified pt about splenic artery aneurysm and needs to f/u with her PMD for possible referral to vascular surgery.  Has appt on 4/3 with her PMD.        Rolan Bucco, MD 12/05/11 1610  Rolan Bucco, MD 01/03/12 1535

## 2011-12-05 NOTE — ED Notes (Signed)
Right flank pain, dysuria, nausea-started yesterday

## 2012-07-01 ENCOUNTER — Encounter (HOSPITAL_BASED_OUTPATIENT_CLINIC_OR_DEPARTMENT_OTHER): Payer: Self-pay | Admitting: Emergency Medicine

## 2012-07-01 ENCOUNTER — Emergency Department (HOSPITAL_BASED_OUTPATIENT_CLINIC_OR_DEPARTMENT_OTHER): Payer: Medicaid Other

## 2012-07-01 ENCOUNTER — Emergency Department (HOSPITAL_BASED_OUTPATIENT_CLINIC_OR_DEPARTMENT_OTHER)
Admission: EM | Admit: 2012-07-01 | Discharge: 2012-07-01 | Disposition: A | Discharge status: Home / Self Care | Payer: Medicaid Other | Attending: Emergency Medicine | Admitting: Emergency Medicine

## 2012-07-01 DIAGNOSIS — M25519 Pain in unspecified shoulder: Secondary | ICD-10-CM | POA: Insufficient documentation

## 2012-07-01 DIAGNOSIS — K219 Gastro-esophageal reflux disease without esophagitis: Secondary | ICD-10-CM | POA: Insufficient documentation

## 2012-07-01 DIAGNOSIS — Z7901 Long term (current) use of anticoagulants: Secondary | ICD-10-CM | POA: Insufficient documentation

## 2012-07-01 DIAGNOSIS — I4891 Unspecified atrial fibrillation: Secondary | ICD-10-CM | POA: Insufficient documentation

## 2012-07-01 DIAGNOSIS — Z79899 Other long term (current) drug therapy: Secondary | ICD-10-CM | POA: Insufficient documentation

## 2012-07-01 DIAGNOSIS — M25511 Pain in right shoulder: Secondary | ICD-10-CM

## 2012-07-01 DIAGNOSIS — I10 Essential (primary) hypertension: Secondary | ICD-10-CM | POA: Insufficient documentation

## 2012-07-01 DIAGNOSIS — E079 Disorder of thyroid, unspecified: Secondary | ICD-10-CM | POA: Insufficient documentation

## 2012-07-01 NOTE — ED Provider Notes (Signed)
Medical screening examination/treatment/procedure(s) were performed by non-physician practitioner and as supervising physician I was immediately available for consultation/collaboration.   Rolan Bucco, MD 07/01/12 [phone]

## 2012-07-01 NOTE — ED Notes (Signed)
Bumped into doorway 2 nights ago and hit right shoulder.  Pain ever since. Is able to lift arm but with pain.

## 2012-07-01 NOTE — ED Provider Notes (Signed)
History     CSN: [id number]  Arrival date & time 07/01/12  1610   First MD Initiated Contact with Patient 07/01/12 1811      Chief Complaint  Patient presents with  . Shoulder Pain    (Consider location/radiation/quality/duration/timing/severity/associated sxs/prior treatment) HPI Comments: This is a 51 year old female, who presents to the emergency department with the chief complaint of right shoulder pain. The patient states that she backed into her toward at her home. She states that she is having stiffness in her right shoulder and that it hurts to lift above her head. She has not tried taking anything to alleviate her symptoms. The pain does not radiate. She is in mild discomfort.  The history is provided by the patient. No language interpreter was used.    Past Medical History  Diagnosis Date  . Atrial fibrillation   . Hypertension   . Back pain   . GERD (gastroesophageal reflux disease)   . Thyroid disease   . Kidney stone     Past Surgical History  Procedure Date  . Cesarean section   . Tonsillectomy   . Appendectomy   . Breast surgery   . Thyroidectomy   . Cesarean section   . Tubal ligation     No family history on file.  History  Substance Use Topics  . Smoking status: Never Smoker   . Smokeless tobacco: Not on file  . Alcohol Use: No    OB History    Grav Para Term Preterm Abortions TAB SAB Ect Mult Living                  Review of Systems  Constitutional: Negative for fever.  HENT: Negative for rhinorrhea.   Eyes: Negative for visual disturbance.  Respiratory: Negative for chest tightness and shortness of breath.   Cardiovascular: Negative for chest pain.  Gastrointestinal: Negative for abdominal pain.  Genitourinary: Negative for dysuria.  Musculoskeletal:       Right shoulder pain  Skin: Negative for rash.  Neurological: Negative for weakness.  Psychiatric/Behavioral: The patient is not nervous/anxious.   All other systems reviewed  and are negative.    Allergies  Motrin and Ultram  Home Medications   Current Outpatient Rx  Name Route Sig Dispense Refill  . CALCIUM CARBONATE-VITAMIN D 600-400 MG-UNIT PO TABS Oral Take 2 tablets by mouth 2 (two) times daily.    . CYCLOBENZAPRINE HCL 10 MG PO TABS Oral Take 10 mg by mouth 3 (three) times daily as needed. For muscle spasms     . DIGOXIN 0.25 MG PO TABS Oral Take 250 mcg by mouth daily.      Marland Kitchen ESOMEPRAZOLE MAGNESIUM 40 MG PO CPDR Oral Take 40 mg by mouth daily before breakfast.      . GABAPENTIN 300 MG PO CAPS Oral Take 900 mg by mouth 2 (two) times daily.    Marland Kitchen HYDROCODONE-ACETAMINOPHEN 5-325 MG PO TABS Oral Take 1-2 tablets by mouth every 6 (six) hours as needed. moderate or severe pain    . LEVOTHYROXINE SODIUM 200 MCG PO TABS Oral Take 200 mcg by mouth daily.    Marland Kitchen LISINOPRIL 5 MG PO TABS Oral Take 5 mg by mouth daily.    Marland Kitchen METOPROLOL SUCCINATE ER 50 MG PO TB24 Oral Take 50 mg by mouth 2 (two) times daily.      . WARFARIN SODIUM 5 MG PO TABS Oral Take 2.5-5 mg by mouth daily. Take 1/2 tab Monday and Friday and take 1  tab on Tuesday, Wednesday, Thursday, Saturday and Sunday      BP 124/63  Pulse 62  Temp 98.2 F (36.8 C) (Oral)  Resp 20  Ht 4\' 11"  (1.499 m)  Wt 200 lb (90.719 kg)  BMI 40.39 kg/m2  SpO2 100%  Physical Exam  Nursing note and vitals reviewed. Constitutional: She is oriented to person, place, and time. She appears well-developed and well-nourished.  HENT:  Head: Normocephalic and atraumatic.  Eyes: Conjunctivae normal and EOM are normal. Pupils are equal, round, and reactive to light.  Neck: Normal range of motion. Neck supple.  Cardiovascular: Normal rate, regular rhythm and normal heart sounds.   Pulmonary/Chest: Effort normal and breath sounds normal.  Abdominal: Soft. Bowel sounds are normal.  Musculoskeletal:       Right shoulder range of motion and strength limited above head secondary to pain. Pain on palpation over joint capsule.  Leanord Asal impingement test positive, empty can test negative  Neurological: She is alert and oriented to person, place, and time.  Skin: Skin is warm and dry.       5x5 cm contusion on right scapula    ED Course  Procedures (including critical care time)  Labs Reviewed - No data to display Dg Shoulder Right  07/01/2012  *RADIOLOGY REPORT*  Clinical Data: Fall.  Shoulder injury.  Shoulder pain and bruising.  RIGHT SHOULDER - 2+ VIEW  Comparison: None.  Findings: No evidence of fracture or dislocation.  Moderate degenerative spurring joint space narrowing is seen involving the acromioclavicular joint.  No other significant bone abnormality identified.  IMPRESSION:  1.  No acute findings. 2.  Acromioclavicular degenerative changes.   Original Report Authenticated By: Danae Orleans, M.D.      1. Right shoulder pain       MDM  This is a 51 year old female, with right shoulder pain. Patient states that she back into her door, and bumped her shoulder. She has a bruise on her scapula and also states that her shoulder hurts. I suspect the patient has right shoulder impingement. The patient is allergic to ibuprofen. The patient takes hydrocodone and Flexeril daily. I encouraged her to continue to use these medications and to use ice for the pain. I have instructed the patient to follow up with her primary care physician regarding physical therapy. The patient is agreeable with this plan. The patient is stable and ready for discharge.        Roxy Horseman, PA-C 07/01/12 1933

## 2012-08-13 IMAGING — CT CT ABD-PELV W/O CM
2 of 4 series · 16 of 46 positions shown, 18 images · non-contrast
Comparison: Unenhanced CT abdomen and pelvis 09/21/2010 [REDACTED]
[HOSPITAL].

CLINICAL DATA: Right flank pain.  Dysuria.  Nausea.  History of
urinary tract calculi.  Surgical history includes cesarean section
and appendectomy.

CT ABDOMEN AND PELVIS WITHOUT CONTRAST 12/05/2011:
TECHNIQUE: Multidetector CT imaging of the abdomen and pelvis was
performed following the standard protocol without intravenous
contrast.

[Series 2: renal stone > 200 lbs 5.0 b31f · axial · 0.78mm/px · z∈[+908,+1298]mm · 13 of 86 slices shown, 15 images]
[im 4/86  soft-tissue]
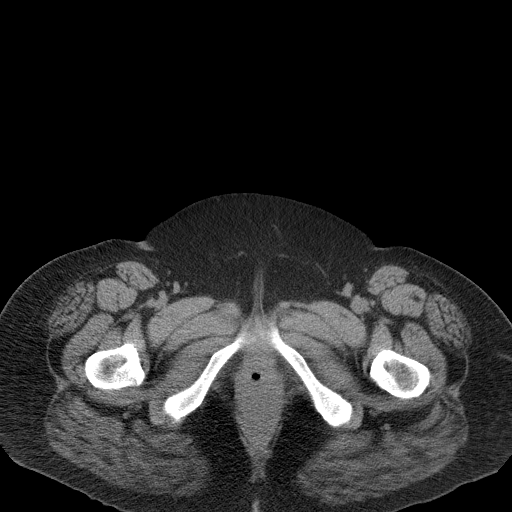
[im 4/86  bone]
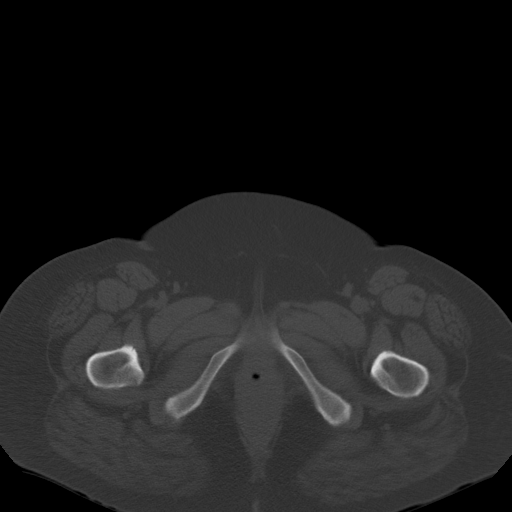
[im 11/86  soft-tissue]
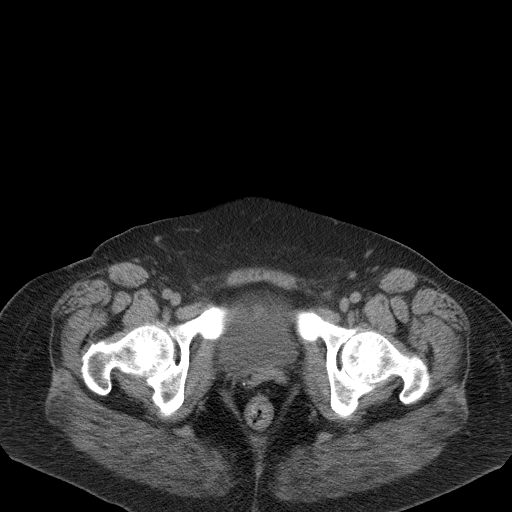
[im 18/86  soft-tissue]
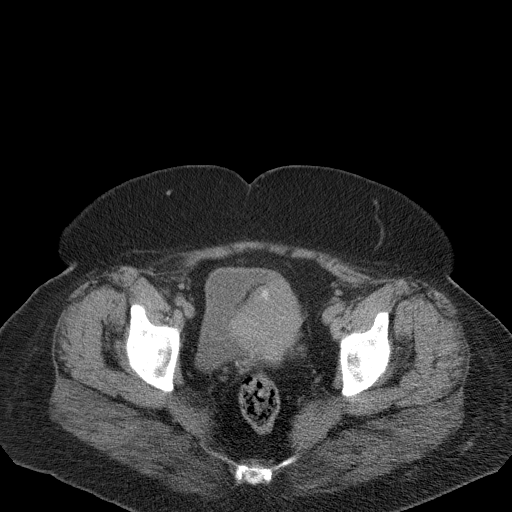
[im 24/86  soft-tissue]
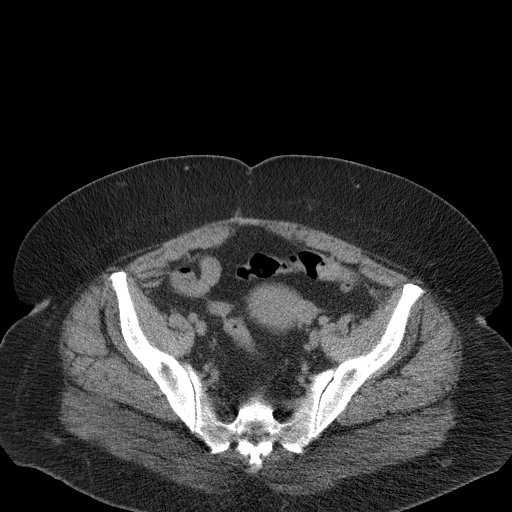
[im 31/86  soft-tissue]
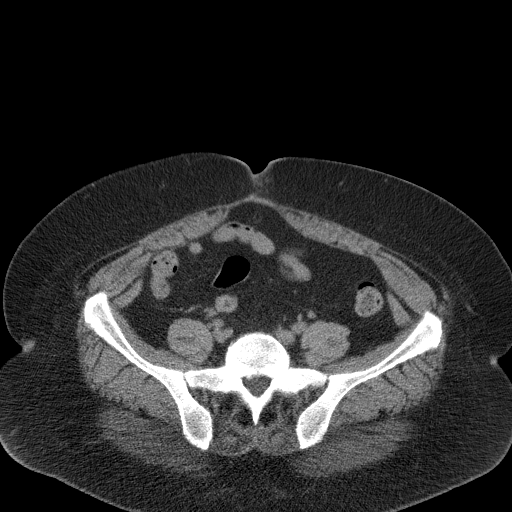
[im 38/86  soft-tissue]
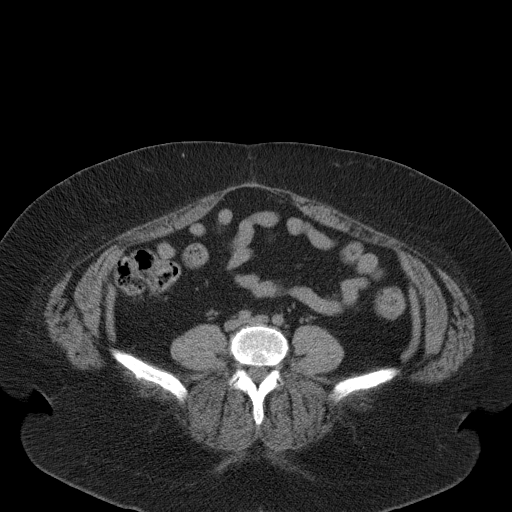
[im 45/86  soft-tissue]
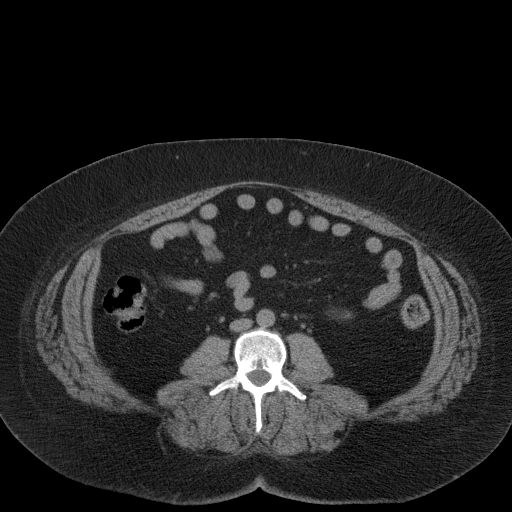
[im 48/86  soft-tissue]
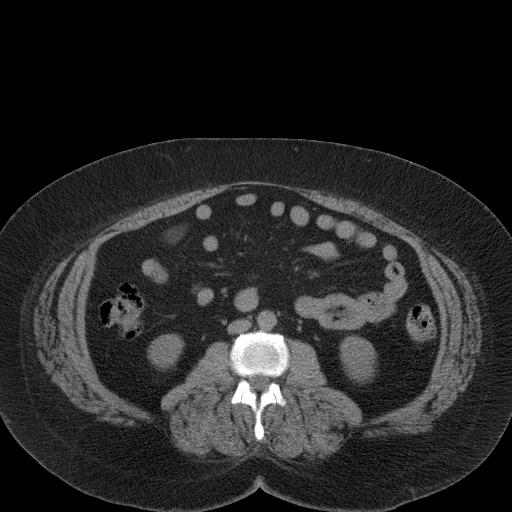
[im 55/86  soft-tissue]
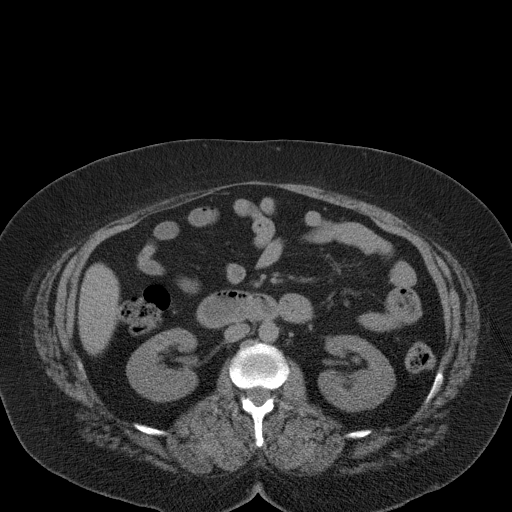
[im 55/86  bone]
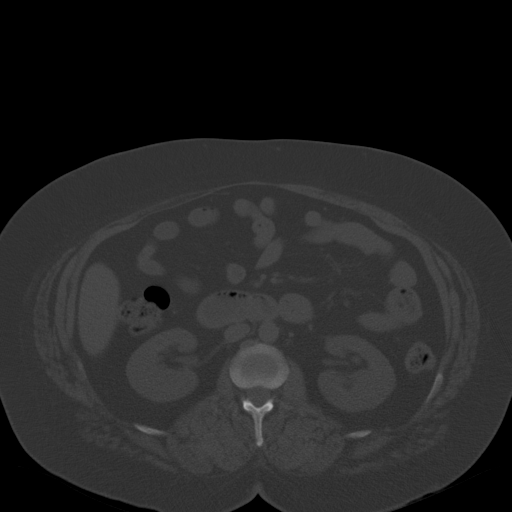
[im 62/86  soft-tissue]
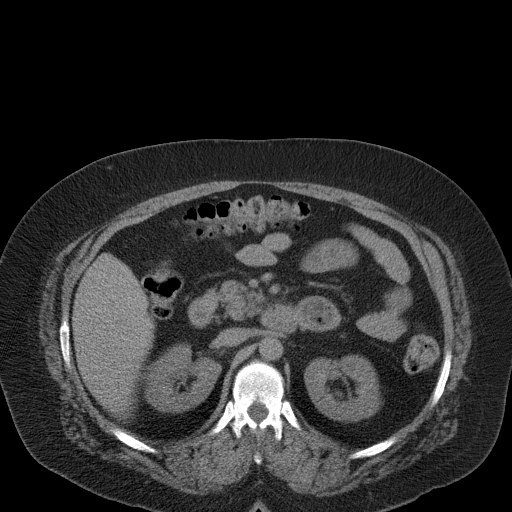
[im 69/86  soft-tissue]
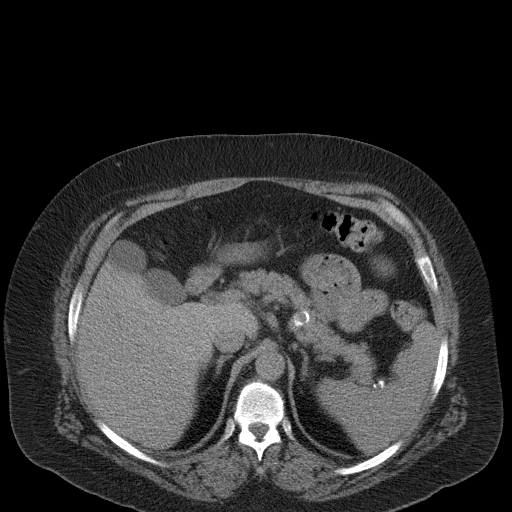
[im 75/86  soft-tissue]
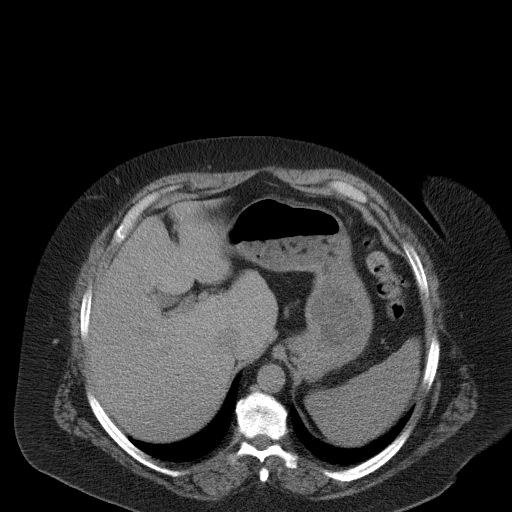
[im 82/86  soft-tissue]
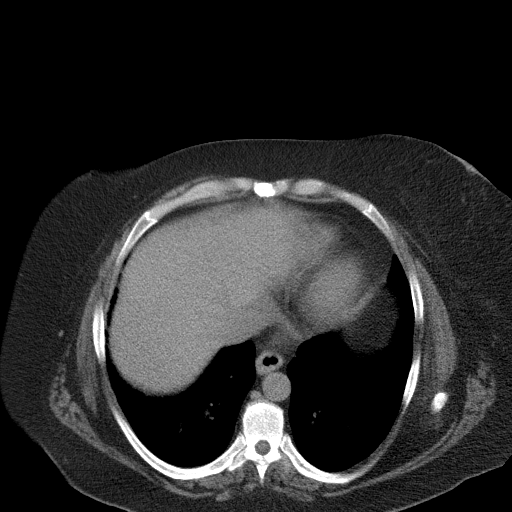

[Series 5: renal stone 3.0 coronal · coronal · 1.08mm/px · 3 of 85 slices shown]
[im 29/85  soft-tissue]
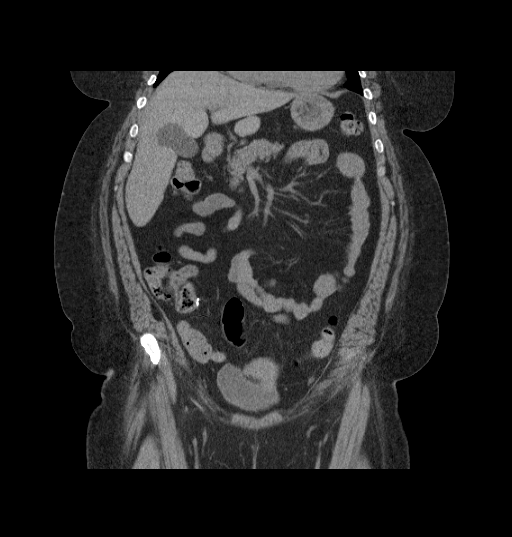
[im 38/85  soft-tissue]
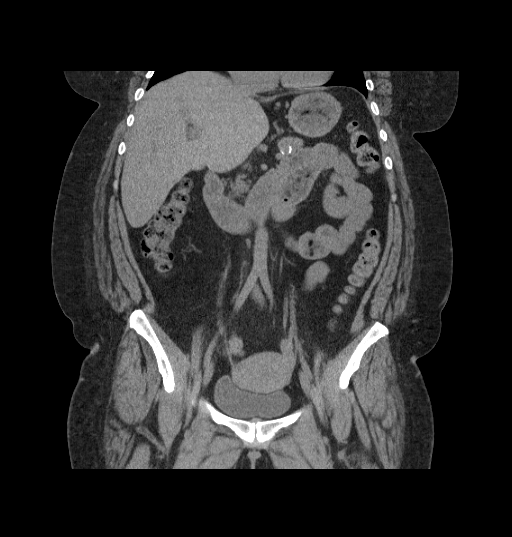
[im 47/85  soft-tissue]
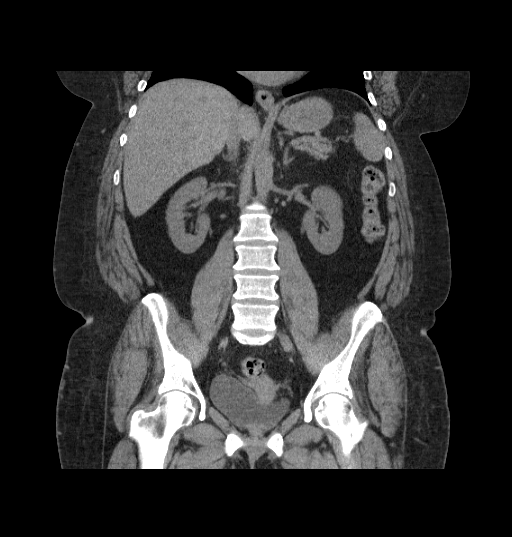

[16 of 46 positions shown; findings below may reference images not displayed]

FINDINGS: Tiny non-obstructing single calculus in the each kidney
as noted on the prior examination, that on the right visualized
best on the sagittal reconstructed images.  No obstructing ureteral
calculi on either side.  No evidence of hydronephrosis involving
either kidney.  Small cortical cysts involving both kidneys.
Within the limits of the unenhanced technique, no significant focal
parenchymal abnormality involving either kidney.

Normal unenhanced appearance of the liver, spleen, pancreas, and
adrenal glands.  Gallbladder unremarkable by CT.  No biliary ductal
dilation.  No visible aorto-iliofemoral atherosclerosis.
Atherosclerosis involving the splenic artery, with a calcified
approximate 11 mm splenic artery aneurysm, unchanged. No
significant lymphadenopathy.

Stomach filled with food and normal in appearance.  Normal-
appearing small bowel and colon.  Surgical clips in the right upper
pelvis from the prior appendectomy.  No ascites.  Umbilical hernia
containing fat.

Multiple uterine fibroids as noted previously, at least two of
which are calcified and degenerated.  Normal-appearing ovaries by
CT.  No adnexal masses or free pelvic fluid.  Urinary bladder
unremarkable.  Phleboliths low in the right side of the pelvis.
Bone window images demonstrate mild lower thoracic and lumbar spine
spondylosis and a chronic disc extrusion at L5-S1 with
calcification the posterior annular fibers.  Visualized lung bases
clear.
IMPRESSION: 1.  No evidence of urinary tract obstruction on either side.
2.  Tiny calculus in a posterior mid calix of the left kidney and
an anterior lower pole calix of the right kidney.
3.  No acute abnormalities involving the abdomen pelvis.
4.  Uterine fibroids.
5.  Umbilical hernia containing fat.
6.  Calcified 11 mm splenic artery aneurysm.

## 2013-01-22 ENCOUNTER — Encounter (HOSPITAL_BASED_OUTPATIENT_CLINIC_OR_DEPARTMENT_OTHER): Payer: Self-pay | Admitting: *Deleted

## 2013-01-22 ENCOUNTER — Emergency Department (HOSPITAL_BASED_OUTPATIENT_CLINIC_OR_DEPARTMENT_OTHER): Payer: Medicaid Other

## 2013-01-22 ENCOUNTER — Emergency Department (HOSPITAL_BASED_OUTPATIENT_CLINIC_OR_DEPARTMENT_OTHER)
Admission: EM | Admit: 2013-01-22 | Discharge: 2013-01-22 | Disposition: A | Discharge status: Home / Self Care | Payer: Medicaid Other | Attending: Emergency Medicine | Admitting: Emergency Medicine

## 2013-01-22 DIAGNOSIS — K219 Gastro-esophageal reflux disease without esophagitis: Secondary | ICD-10-CM | POA: Insufficient documentation

## 2013-01-22 DIAGNOSIS — E079 Disorder of thyroid, unspecified: Secondary | ICD-10-CM | POA: Insufficient documentation

## 2013-01-22 DIAGNOSIS — Y9289 Other specified places as the place of occurrence of the external cause: Secondary | ICD-10-CM | POA: Insufficient documentation

## 2013-01-22 DIAGNOSIS — IMO0002 Reserved for concepts with insufficient information to code with codable children: Secondary | ICD-10-CM | POA: Insufficient documentation

## 2013-01-22 DIAGNOSIS — I4891 Unspecified atrial fibrillation: Secondary | ICD-10-CM | POA: Insufficient documentation

## 2013-01-22 DIAGNOSIS — X500XXA Overexertion from strenuous movement or load, initial encounter: Secondary | ICD-10-CM | POA: Insufficient documentation

## 2013-01-22 DIAGNOSIS — Z87442 Personal history of urinary calculi: Secondary | ICD-10-CM | POA: Insufficient documentation

## 2013-01-22 DIAGNOSIS — Y9389 Activity, other specified: Secondary | ICD-10-CM | POA: Insufficient documentation

## 2013-01-22 DIAGNOSIS — I10 Essential (primary) hypertension: Secondary | ICD-10-CM | POA: Insufficient documentation

## 2013-01-22 DIAGNOSIS — Z79899 Other long term (current) drug therapy: Secondary | ICD-10-CM | POA: Insufficient documentation

## 2013-01-22 DIAGNOSIS — Z7901 Long term (current) use of anticoagulants: Secondary | ICD-10-CM | POA: Insufficient documentation

## 2013-01-22 DIAGNOSIS — Z8739 Personal history of other diseases of the musculoskeletal system and connective tissue: Secondary | ICD-10-CM | POA: Insufficient documentation

## 2013-01-22 DIAGNOSIS — S8391XA Sprain of unspecified site of right knee, initial encounter: Secondary | ICD-10-CM

## 2013-01-22 MED ORDER — OXYCODONE-ACETAMINOPHEN 5-325 MG PO TABS: 2.0000 | ORAL_TABLET | ORAL | Status: DC | PRN

## 2013-01-22 NOTE — ED Provider Notes (Signed)
History     CSN: [id number]  Arrival date & time 01/22/13  1305   First MD Initiated Contact with Patient 01/22/13 1338      Chief Complaint  Patient presents with  . Knee Injury    (Consider location/radiation/quality/duration/timing/severity/associated sxs/prior treatment) The history is provided by the patient.   Patient here with right knee pain after injuring it while standing up. Developed pop initially. Pain characterized as sharp and worse with ambulation. No tumor used prior to arrival. Denies any swelling at this time. She is on Coumadin Past Medical History  Diagnosis Date  . Atrial fibrillation   . Hypertension   . Back pain   . GERD (gastroesophageal reflux disease)   . Thyroid disease   . Kidney stone     Past Surgical History  Procedure Laterality Date  . Cesarean section    . Tonsillectomy    . Appendectomy    . Breast surgery    . Thyroidectomy    . Cesarean section    . Tubal ligation      History reviewed. No pertinent family history.  History  Substance Use Topics  . Smoking status: Never Smoker   . Smokeless tobacco: Not on file  . Alcohol Use: No    OB History   Grav Para Term Preterm Abortions TAB SAB Ect Mult Living                  Review of Systems  All other systems reviewed and are negative.    Allergies  Motrin and Ultram  Home Medications   Current Outpatient Rx  Name  Route  Sig  Dispense  Refill  . Calcium Carbonate-Vitamin D (CALTRATE 600+D) 600-400 MG-UNIT per tablet   Oral   Take 2 tablets by mouth 2 (two) times daily.         . cyclobenzaprine (FLEXERIL) 10 MG tablet   Oral   Take 10 mg by mouth 3 (three) times daily as needed. For muscle spasms          . digoxin (LANOXIN) 0.25 MG tablet   Oral   Take 250 mcg by mouth daily.           Marland Kitchen esomeprazole (NEXIUM) 40 MG capsule   Oral   Take 40 mg by mouth daily before breakfast.           . gabapentin (NEURONTIN) 300 MG capsule   Oral   Take  900 mg by mouth 2 (two) times daily.         Marland Kitchen HYDROcodone-acetaminophen (NORCO) 5-325 MG per tablet   Oral   Take 1-2 tablets by mouth every 6 (six) hours as needed. moderate or severe pain         . levothyroxine (SYNTHROID, LEVOTHROID) 200 MCG tablet   Oral   Take 200 mcg by mouth daily.         Marland Kitchen lisinopril (PRINIVIL,ZESTRIL) 5 MG tablet   Oral   Take 5 mg by mouth daily.         . metoprolol (TOPROL-XL) 50 MG 24 hr tablet   Oral   Take 50 mg by mouth 2 (two) times daily.           Marland Kitchen warfarin (COUMADIN) 5 MG tablet   Oral   Take 2.5-5 mg by mouth daily. Take 1/2 tab Monday and Friday and take 1 tab on Tuesday, Wednesday, Thursday, Saturday and Sunday  BP 109/41  Pulse 69  Temp(Src) 98.7 F (37.1 C) (Oral)  Resp 20  Ht 4\' 11"  (1.499 m)  Wt 210 lb (95.255 kg)  BMI 42.39 kg/m2  SpO2 95%  Physical Exam  Nursing note and vitals reviewed. Constitutional: She is oriented to person, place, and time. She appears well-developed and well-nourished.  Non-toxic appearance.  HENT:  Head: Normocephalic and atraumatic.  Eyes: Conjunctivae are normal. Pupils are equal, round, and reactive to light.  Neck: Normal range of motion.  Cardiovascular: Normal rate.   Pulmonary/Chest: Effort normal.  Musculoskeletal:       Right knee: She exhibits normal range of motion, no swelling and no effusion.       Legs: Neurological: She is alert and oriented to person, place, and time.  Skin: Skin is warm and dry.  Psychiatric: She has a normal mood and affect.    ED Course  Procedures (including critical care time)  Labs Reviewed - No data to display No results found.   No diagnosis found.    MDM  pts knee xray neg, stable for d/c        Toy Baker, MD 01/22/13 1452

## 2013-01-22 NOTE — ED Notes (Signed)
Pt states she went to get up yesterday and felt right knee pop. C/O pain to same.

## 2013-05-16 DIAGNOSIS — K7460 Unspecified cirrhosis of liver: Secondary | ICD-10-CM

## 2013-05-16 HISTORY — PX: CHOLECYSTECTOMY: SHX55

## 2013-05-16 HISTORY — DX: Unspecified cirrhosis of liver: K74.60

## 2013-07-30 ENCOUNTER — Encounter (HOSPITAL_BASED_OUTPATIENT_CLINIC_OR_DEPARTMENT_OTHER): Payer: Self-pay | Admitting: Emergency Medicine

## 2013-07-30 ENCOUNTER — Emergency Department (HOSPITAL_BASED_OUTPATIENT_CLINIC_OR_DEPARTMENT_OTHER)
Admission: EM | Admit: 2013-07-30 | Discharge: 2013-07-30 | Disposition: A | Discharge status: Home / Self Care | Payer: Medicaid Other | Attending: Emergency Medicine | Admitting: Emergency Medicine

## 2013-07-30 ENCOUNTER — Emergency Department (HOSPITAL_BASED_OUTPATIENT_CLINIC_OR_DEPARTMENT_OTHER): Payer: Medicaid Other

## 2013-07-30 DIAGNOSIS — S6000XA Contusion of unspecified finger without damage to nail, initial encounter: Secondary | ICD-10-CM | POA: Insufficient documentation

## 2013-07-30 DIAGNOSIS — W230XXA Caught, crushed, jammed, or pinched between moving objects, initial encounter: Secondary | ICD-10-CM | POA: Insufficient documentation

## 2013-07-30 DIAGNOSIS — I4891 Unspecified atrial fibrillation: Secondary | ICD-10-CM | POA: Insufficient documentation

## 2013-07-30 DIAGNOSIS — Y929 Unspecified place or not applicable: Secondary | ICD-10-CM | POA: Insufficient documentation

## 2013-07-30 DIAGNOSIS — K219 Gastro-esophageal reflux disease without esophagitis: Secondary | ICD-10-CM | POA: Insufficient documentation

## 2013-07-30 DIAGNOSIS — E079 Disorder of thyroid, unspecified: Secondary | ICD-10-CM | POA: Insufficient documentation

## 2013-07-30 DIAGNOSIS — Y939 Activity, unspecified: Secondary | ICD-10-CM | POA: Insufficient documentation

## 2013-07-30 DIAGNOSIS — Z8739 Personal history of other diseases of the musculoskeletal system and connective tissue: Secondary | ICD-10-CM | POA: Insufficient documentation

## 2013-07-30 DIAGNOSIS — I10 Essential (primary) hypertension: Secondary | ICD-10-CM | POA: Insufficient documentation

## 2013-07-30 DIAGNOSIS — Z87442 Personal history of urinary calculi: Secondary | ICD-10-CM | POA: Insufficient documentation

## 2013-07-30 DIAGNOSIS — Z79899 Other long term (current) drug therapy: Secondary | ICD-10-CM | POA: Insufficient documentation

## 2013-07-30 MED ORDER — HYDROCODONE-ACETAMINOPHEN 5-325 MG PO TABS: 1.0000 | ORAL_TABLET | Freq: Four times a day (QID) | ORAL | Status: DC | PRN

## 2013-07-30 NOTE — ED Notes (Signed)
Patient here with left 5th digit injury after shutting same in drawer. Pain with any movement

## 2013-07-30 NOTE — ED Provider Notes (Signed)
CSN: [id number]     Arrival date & time 07/30/13  1318 History   First MD Initiated Contact with Patient 07/30/13 1508     Chief Complaint  Patient presents with  . Finger Injury   (Consider location/radiation/quality/duration/timing/severity/associated sxs/prior Treatment) HPI Comments: Grandson slammed pinky in door last night, still painful this morning.   Patient is a 52 y.o. female presenting with hand pain. The history is provided by the patient. No language interpreter was used.  Hand Pain This is a new problem. The current episode started yesterday. The problem occurs constantly. The problem has not changed since onset.Pertinent negatives include no chest pain, no abdominal pain, no headaches and no shortness of breath. Exacerbated by: bending, touching. Nothing relieves the symptoms. She has tried rest and a cold compress for the symptoms. The treatment provided no relief.    Past Medical History  Diagnosis Date  . Atrial fibrillation   . Hypertension   . Back pain   . GERD (gastroesophageal reflux disease)   . Thyroid disease   . Kidney stone    Past Surgical History  Procedure Laterality Date  . Cesarean section    . Tonsillectomy    . Appendectomy    . Breast surgery    . Thyroidectomy    . Cesarean section    . Tubal ligation     No family history on file. History  Substance Use Topics  . Smoking status: Never Smoker   . Smokeless tobacco: Not on file  . Alcohol Use: No   OB History   Grav Para Term Preterm Abortions TAB SAB Ect Mult Living                 Review of Systems  Constitutional: Negative for fever, chills, diaphoresis, activity change, appetite change and fatigue.  HENT: Negative for congestion, facial swelling, rhinorrhea and sore throat.   Eyes: Negative for photophobia and discharge.  Respiratory: Negative for cough, chest tightness and shortness of breath.   Cardiovascular: Negative for chest pain, palpitations and leg swelling.    Gastrointestinal: Negative for nausea, vomiting, abdominal pain and diarrhea.  Endocrine: Negative for polydipsia and polyuria.  Genitourinary: Negative for dysuria, frequency, difficulty urinating and pelvic pain.  Musculoskeletal: Negative for arthralgias, back pain, neck pain and neck stiffness.  Skin: Negative for color change and wound.  Allergic/Immunologic: Negative for immunocompromised state.  Neurological: Negative for facial asymmetry, weakness, numbness and headaches.  Hematological: Does not bruise/bleed easily.  Psychiatric/Behavioral: Negative for confusion and agitation.    Allergies  Motrin and Ultram  Home Medications   Current Outpatient Rx  Name  Route  Sig  Dispense  Refill  . Calcium Carbonate-Vitamin D (CALTRATE 600+D) 600-400 MG-UNIT per tablet   Oral   Take 2 tablets by mouth 2 (two) times daily.         . cyclobenzaprine (FLEXERIL) 10 MG tablet   Oral   Take 10 mg by mouth 3 (three) times daily as needed. For muscle spasms          . digoxin (LANOXIN) 0.25 MG tablet   Oral   Take 250 mcg by mouth daily.           Marland Kitchen esomeprazole (NEXIUM) 40 MG capsule   Oral   Take 40 mg by mouth daily before breakfast.           . gabapentin (NEURONTIN) 300 MG capsule   Oral   Take 900 mg by mouth 2 (two) times daily.         Marland Kitchen  HYDROcodone-acetaminophen (NORCO) 5-325 MG per tablet   Oral   Take 1-2 tablets by mouth every 6 (six) hours as needed. moderate or severe pain         . HYDROcodone-acetaminophen (NORCO) 5-325 MG per tablet   Oral   Take 1 tablet by mouth every 6 (six) hours as needed.   6 tablet   0   . levothyroxine (SYNTHROID, LEVOTHROID) 200 MCG tablet   Oral   Take 200 mcg by mouth daily.         Marland Kitchen lisinopril (PRINIVIL,ZESTRIL) 5 MG tablet   Oral   Take 5 mg by mouth daily.         . metoprolol (TOPROL-XL) 50 MG 24 hr tablet   Oral   Take 50 mg by mouth 2 (two) times daily.           Marland Kitchen oxyCODONE-acetaminophen  (PERCOCET/ROXICET) 5-325 MG per tablet   Oral   Take 2 tablets by mouth every 4 (four) hours as needed for pain.   12 tablet   0   . warfarin (COUMADIN) 5 MG tablet   Oral   Take 2.5-5 mg by mouth daily. Take 1/2 tab Monday and Friday and take 1 tab on Tuesday, Wednesday, Thursday, Saturday and Sunday          BP 139/51  Pulse 68  Temp(Src) 99 F (37.2 C) (Oral)  Resp 18  SpO2 98% Physical Exam  Constitutional: She is oriented to person, place, and time. She appears well-developed and well-nourished. No distress.  HENT:  Head: Normocephalic and atraumatic.  Mouth/Throat: No oropharyngeal exudate.  Eyes: Pupils are equal, round, and reactive to light.  Neck: Normal range of motion. Neck supple.  Cardiovascular: Normal rate, regular rhythm and normal heart sounds.  Exam reveals no gallop and no friction rub.   No murmur heard. Pulmonary/Chest: Effort normal and breath sounds normal. No respiratory distress. She has no wheezes. She has no rales.  Abdominal: Soft. Bowel sounds are normal. She exhibits no distension and no mass. There is no tenderness. There is no rebound and no guarding.  Musculoskeletal: Normal range of motion. She exhibits tenderness. She exhibits no edema.       Arms: Pain over entire 5th digit with mild swelling, but no deformity, laceration, ecchymosis.   Neurological: She is alert and oriented to person, place, and time.  Skin: Skin is warm and dry.  Psychiatric: She has a normal mood and affect.    ED Course  Procedures (including critical care time) Labs Review Labs Reviewed - No data to display Imaging Review Dg Hand Complete Left  07/30/2013   CLINICAL DATA:  Left hand was slammed in a door today. Pain mostly in her 5th digit.  EXAM: LEFT HAND - COMPLETE 3+ VIEW  COMPARISON:  10/24/2011.  FINDINGS: The left hand demonstrates no fracture or dislocation. There are mild degenerative changes of the 5th PIP joint. There is no soft tissue swelling.   IMPRESSION: No acute osseous injury of the left hand.   Electronically Signed   By: Elige Ko   On: 07/30/2013 13:56    EKG Interpretation   None       MDM   1. Finger contusion, initial encounter    Pt is a 52 y.o. female with Pmhx as above who presents with L pinky finger pain after slammed in door yesterday.  No lacerations or deformities on PE, NVI distally.  XR shows no acute fracture. I believe she has  contusion. Will plan in splint for comfort, recommend regular ROM, and repeat XR in 1 week with PCP if still having pain.         Shanna Cisco, MD 07/30/13 [phone]

## 2013-09-24 ENCOUNTER — Emergency Department (HOSPITAL_BASED_OUTPATIENT_CLINIC_OR_DEPARTMENT_OTHER)
Admission: EM | Admit: 2013-09-24 | Discharge: 2013-09-24 | Disposition: A | Discharge status: Home / Self Care | Payer: Medicaid Other | Attending: Emergency Medicine | Admitting: Emergency Medicine

## 2013-09-24 ENCOUNTER — Encounter (HOSPITAL_BASED_OUTPATIENT_CLINIC_OR_DEPARTMENT_OTHER): Payer: Self-pay | Admitting: Emergency Medicine

## 2013-09-24 ENCOUNTER — Emergency Department (HOSPITAL_BASED_OUTPATIENT_CLINIC_OR_DEPARTMENT_OTHER): Payer: Medicaid Other

## 2013-09-24 DIAGNOSIS — S8002XA Contusion of left knee, initial encounter: Secondary | ICD-10-CM

## 2013-09-24 DIAGNOSIS — S8000XA Contusion of unspecified knee, initial encounter: Secondary | ICD-10-CM | POA: Insufficient documentation

## 2013-09-24 DIAGNOSIS — I10 Essential (primary) hypertension: Secondary | ICD-10-CM | POA: Insufficient documentation

## 2013-09-24 DIAGNOSIS — Y929 Unspecified place or not applicable: Secondary | ICD-10-CM | POA: Insufficient documentation

## 2013-09-24 DIAGNOSIS — K219 Gastro-esophageal reflux disease without esophagitis: Secondary | ICD-10-CM | POA: Insufficient documentation

## 2013-09-24 DIAGNOSIS — Y9301 Activity, walking, marching and hiking: Secondary | ICD-10-CM | POA: Insufficient documentation

## 2013-09-24 DIAGNOSIS — I4891 Unspecified atrial fibrillation: Secondary | ICD-10-CM | POA: Insufficient documentation

## 2013-09-24 DIAGNOSIS — Z79899 Other long term (current) drug therapy: Secondary | ICD-10-CM | POA: Insufficient documentation

## 2013-09-24 DIAGNOSIS — R296 Repeated falls: Secondary | ICD-10-CM | POA: Insufficient documentation

## 2013-09-24 DIAGNOSIS — E079 Disorder of thyroid, unspecified: Secondary | ICD-10-CM | POA: Insufficient documentation

## 2013-09-24 DIAGNOSIS — Z87442 Personal history of urinary calculi: Secondary | ICD-10-CM | POA: Insufficient documentation

## 2013-09-24 MED ORDER — HYDROCODONE-ACETAMINOPHEN 5-325 MG PO TABS: 1.0000 | ORAL_TABLET | Freq: Four times a day (QID) | ORAL | Status: DC | PRN

## 2013-09-24 NOTE — ED Notes (Signed)
Pt given rx x 1 for vicodin. D/c with family

## 2013-09-24 NOTE — ED Notes (Signed)
MD at bedside. 

## 2013-09-24 NOTE — ED Notes (Signed)
Pt fell approximately one week ago and injured left knee.  Pt having pain with ambulation.

## 2013-09-24 NOTE — Discharge Instructions (Signed)

## 2013-09-24 NOTE — ED Provider Notes (Signed)
CSN: [id number]     Arrival date & time 09/24/13  1421 History  This chart was scribed for Lori Hacker, MD by Ludger Nutting, ED Scribe. This patient was seen in room [facility] and the patient's care was started 4:05 PM.    Chief Complaint  Patient presents with  . Knee Pain    The history is provided by the patient. No language interpreter was used.    HPI Comments: Lori Case is a 53 y.o. female who presents to the Emergency Department complaining of a left knee injury that occurred about 5 days ago. She states she was walking when she had a fall and fell face forward. She is having constant, unchanged left knee pain and rates it currently as a 4/10. She states walking worsens her pain. She has been taking Tylenol without relief. She denies taking any blood thinners. She denies numbness or weakness. Denies other injury.  Past Medical History  Diagnosis Date  . Atrial fibrillation   . Hypertension   . Back pain   . GERD (gastroesophageal reflux disease)   . Thyroid disease   . Kidney stone    Past Surgical History  Procedure Laterality Date  . Cesarean section    . Tonsillectomy    . Appendectomy    . Breast surgery    . Thyroidectomy    . Cesarean section    . Tubal ligation     No family history on file. History  Substance Use Topics  . Smoking status: Never Smoker   . Smokeless tobacco: Not on file  . Alcohol Use: No   OB History   Grav Para Term Preterm Abortions TAB SAB Ect Mult Living                 Review of Systems  Constitutional: Negative for fever.  Musculoskeletal: Negative for back pain.       Knee pain  Skin: Negative for wound.  All other systems reviewed and are negative.    Allergies  Motrin and Ultram  Home Medications   Current Outpatient Rx  Name  Route  Sig  Dispense  Refill  . Calcium Carbonate-Vitamin D (CALTRATE 600+D) 600-400 MG-UNIT per tablet   Oral   Take 2 tablets by mouth 2 (two) times daily.         .  cyclobenzaprine (FLEXERIL) 10 MG tablet   Oral   Take 10 mg by mouth 3 (three) times daily as needed. For muscle spasms          . digoxin (LANOXIN) 0.25 MG tablet   Oral   Take 250 mcg by mouth daily.           Marland Kitchen esomeprazole (NEXIUM) 40 MG capsule   Oral   Take 40 mg by mouth daily before breakfast.           . HYDROcodone-acetaminophen (NORCO/VICODIN) 5-325 MG per tablet   Oral   Take 1 tablet by mouth every 6 (six) hours as needed.   10 tablet   0   . levothyroxine (SYNTHROID, LEVOTHROID) 200 MCG tablet   Oral   Take 200 mcg by mouth daily.         Marland Kitchen lisinopril (PRINIVIL,ZESTRIL) 5 MG tablet   Oral   Take 5 mg by mouth daily.         . metoprolol (TOPROL-XL) 50 MG 24 hr tablet   Oral   Take 50 mg by mouth 2 (two) times daily.  BP 115/38  Pulse 60  Temp(Src) 98.2 F (36.8 C) (Oral)  Resp 18  Ht 4\' 11"  (1.499 m)  Wt 219 lb (99.338 kg)  BMI 44.21 kg/m2  SpO2 98% Physical Exam  Nursing note and vitals reviewed. Constitutional: She is oriented to person, place, and time.  Appears older than stated age, obese  HENT:  Head: Normocephalic and atraumatic.  Eyes: Pupils are equal, round, and reactive to light.  Neck: Neck supple.  Cardiovascular: Normal rate and regular rhythm.   Pulmonary/Chest: Effort normal. No respiratory distress.  Musculoskeletal: She exhibits no edema.  The examination of the left knee reveals tenderness to palpation over the anterior patella, no significant swelling noted, no joint line tenderness, patient has full range of motion without overlying skin changes  Neurological: She is alert and oriented to person, place, and time.  Skin: Skin is warm and dry.  Psychiatric: She has a normal mood and affect.    ED Course  Procedures (including critical care time)  DIAGNOSTIC STUDIES: Oxygen Saturation is 98% on RA, normal by my interpretation.    COORDINATION OF CARE: 4:04 PM Discussed treatment plan with pt at  bedside and pt agreed to plan.   Labs Review Labs Reviewed - No data to display Imaging Review Dg Knee Complete 4 Views Left  09/24/2013   CLINICAL DATA:  53 year old female with left knee pain following injury.  EXAM: LEFT KNEE - COMPLETE 4+ VIEW  COMPARISON:  None.  FINDINGS: No evidence of acute fracture, subluxation or dislocation identified.  No joint effusion noted.  No radio-opaque foreign bodies are present.  No focal bony lesions are noted.  The joint spaces are unremarkable.  IMPRESSION: Negative.   Electronically Signed   By: Hassan Rowan M.D.   On: 09/24/2013 15:05    EKG Interpretation   None       MDM   1. Knee contusion, left, initial encounter    Patient presents with left knee pain. On exam she is nontoxic and has full range of motion of the left knee. There's no signs of overlying skin changes or infection. X-rays negative for fracture. Suspect patient sustained a contusion given tenderness to palpation over the anterior patella. I have encouraged her to use rest, ice, compression, and elevation. I will give her a short course of Norco for pain management. She was instructed not to use this with additional Tylenol.  After history, exam, and medical workup I feel the patient has been appropriately medically screened and is safe for discharge home. Pertinent diagnoses were discussed with the patient. Patient was given return precautions.  I personally performed the services described in this documentation, which was scribed in my presence. The recorded information has been reviewed and is accurate.   Lori Hacker, MD 09/24/13 [phone]

## 2013-10-29 ENCOUNTER — Encounter (HOSPITAL_BASED_OUTPATIENT_CLINIC_OR_DEPARTMENT_OTHER): Payer: Self-pay | Admitting: Emergency Medicine

## 2013-10-29 ENCOUNTER — Emergency Department (HOSPITAL_BASED_OUTPATIENT_CLINIC_OR_DEPARTMENT_OTHER)
Admission: EM | Admit: 2013-10-29 | Discharge: 2013-10-29 | Disposition: A | Discharge status: Home / Self Care | Payer: Medicaid Other | Attending: Emergency Medicine | Admitting: Emergency Medicine

## 2013-10-29 DIAGNOSIS — H6120 Impacted cerumen, unspecified ear: Secondary | ICD-10-CM | POA: Insufficient documentation

## 2013-10-29 DIAGNOSIS — E079 Disorder of thyroid, unspecified: Secondary | ICD-10-CM | POA: Insufficient documentation

## 2013-10-29 DIAGNOSIS — K219 Gastro-esophageal reflux disease without esophagitis: Secondary | ICD-10-CM | POA: Insufficient documentation

## 2013-10-29 DIAGNOSIS — R109 Unspecified abdominal pain: Secondary | ICD-10-CM | POA: Insufficient documentation

## 2013-10-29 DIAGNOSIS — R110 Nausea: Secondary | ICD-10-CM | POA: Insufficient documentation

## 2013-10-29 DIAGNOSIS — Z87442 Personal history of urinary calculi: Secondary | ICD-10-CM | POA: Insufficient documentation

## 2013-10-29 DIAGNOSIS — Z9851 Tubal ligation status: Secondary | ICD-10-CM | POA: Insufficient documentation

## 2013-10-29 DIAGNOSIS — Z79899 Other long term (current) drug therapy: Secondary | ICD-10-CM | POA: Insufficient documentation

## 2013-10-29 DIAGNOSIS — R300 Dysuria: Secondary | ICD-10-CM | POA: Insufficient documentation

## 2013-10-29 DIAGNOSIS — I10 Essential (primary) hypertension: Secondary | ICD-10-CM | POA: Insufficient documentation

## 2013-10-29 DIAGNOSIS — Z9089 Acquired absence of other organs: Secondary | ICD-10-CM | POA: Insufficient documentation

## 2013-10-29 DIAGNOSIS — I4891 Unspecified atrial fibrillation: Secondary | ICD-10-CM | POA: Insufficient documentation

## 2013-10-29 LAB — URINALYSIS, ROUTINE W REFLEX MICROSCOPIC
Bilirubin Urine: NEGATIVE
GLUCOSE, UA: NEGATIVE mg/dL
HGB URINE DIPSTICK: NEGATIVE
Ketones, ur: NEGATIVE mg/dL
LEUKOCYTES UA: NEGATIVE
Nitrite: NEGATIVE
PH: 7 (ref 5.0–8.0)
Protein, ur: NEGATIVE mg/dL
Specific Gravity, Urine: 1.012 (ref 1.005–1.030)
Urobilinogen, UA: 2 mg/dL — ABNORMAL HIGH (ref 0.0–1.0)

## 2013-10-29 MED ORDER — HYDROCODONE-ACETAMINOPHEN 5-325 MG PO TABS: 1.0000 | ORAL_TABLET | Freq: Four times a day (QID) | ORAL | Status: AC | PRN

## 2013-10-29 MED ORDER — DOCUSATE SODIUM 50 MG/5ML PO LIQD
50.0000 mg | Freq: Once | ORAL | Status: AC
  Administered 2013-10-29: 50 mg via OTIC
  Filled 2013-10-29: qty 10

## 2013-10-29 NOTE — ED Notes (Signed)
Also reporting non productive cough/ right ear cerumen impaction.

## 2013-10-29 NOTE — Discharge Instructions (Signed)
Cerumen Impaction A cerumen impaction is when the wax in your ear forms a plug. This plug usually causes reduced hearing. Sometimes it also causes an earache or dizziness. Removing a cerumen impaction can be difficult and painful. The wax sticks to the ear canal. The canal is sensitive and bleeds easily. If you try to remove a heavy wax buildup with a cotton tipped swab, you may push it in further. Irrigation with water, suction, and small ear curettes may be used to clear out the wax. If the impaction is fixed to the skin in the ear canal, ear drops may be needed for a few days to loosen the wax. People who build up a lot of wax frequently can use ear wax removal products available in your local drugstore. SEEK MEDICAL CARE IF:  You develop an earache, increased hearing loss, or marked dizziness. Document Released: 10/09/2004 Document Revised: 11/24/2011 Document Reviewed: 11/29/2009 [employer] Patient Information 2014 [place], [place].  Flank Pain Flank pain refers to pain that is located on the side of the body between the upper abdomen and the back. The pain may occur over a short period of time (acute) or may be long-term or reoccurring (chronic). It may be mild or severe. Flank pain can be caused by many things. CAUSES  Some of the more common causes of flank pain include:  Muscle strains.   Muscle spasms.   A disease of your spine (vertebral disk disease).   A lung infection (pneumonia).   Fluid around your lungs (pulmonary edema).   A kidney infection.   Kidney stones.   A very painful skin rash caused by the chickenpox virus (shingles).   Gallbladder disease.  [place]ina care will depend on the cause of your pain. In general,  Rest as directed by your caregiver.  Drink enough fluids to keep your urine clear or pale yellow.  Only take over-the-counter or prescription medicines as directed by your caregiver. Some medicines may help relieve the  pain.  Tell your caregiver about any changes in your pain.  Follow up with your caregiver as directed. SEEK IMMEDIATE MEDICAL CARE IF:   Your pain is not controlled with medicine.   You have new or worsening symptoms.  Your pain increases.   You have abdominal pain.   You have shortness of breath.   You have persistent nausea or vomiting.   You have swelling in your abdomen.   You feel faint or pass out.   You have blood in your urine.  You have a fever or persistent symptoms for more than 2 3 days.  You have a fever and your symptoms suddenly get worse. MAKE SURE YOU:   Understand these instructions.  Will watch your condition.  Will get help right away if you are not doing well or get worse. Document Released: 10/23/2005 Document Revised: 05/26/2012 Document Reviewed: 04/15/2012 [facility] Patient Information 2014 [place].

## 2013-10-29 NOTE — ED Notes (Signed)
Onset of dysuria, flank pain yesterday.  C/o feeling feverish.  Prior history of UTI/kidney infections.  C/o nausea, no vomiting/diarrhea.

## 2013-10-29 NOTE — ED Provider Notes (Signed)
CSN: [id number]     Arrival date & time 10/29/13  1937 History  This chart was scribed for Charles B. Karle Starch, MD by Rolanda Lundborg, ED Scribe. This patient was seen in room MH11/MH11 and the patient's care was started at 9:57 PM.    Chief Complaint  Patient presents with  . Flank Pain   The history is provided by the patient. No language interpreter was used.   HPI Comments: Lori Case is a 53 y.o. female who presents to the Emergency Department complaining of intermittent right flank pain onset last night with dysuria. Her last kidney stone was a few months ago. She reports nausea but no vomiting. She denies hematuria, fevers.   She also reports having a cerumen impaction in her right ear. She states it was found by her PCP this week when she went for a check up.  Past Medical History  Diagnosis Date  . Atrial fibrillation   . Hypertension   . Back pain   . GERD (gastroesophageal reflux disease)   . Thyroid disease   . Kidney stone    Past Surgical History  Procedure Laterality Date  . Cesarean section    . Tonsillectomy    . Appendectomy    . Breast surgery    . Thyroidectomy    . Cesarean section    . Tubal ligation     No family history on file. History  Substance Use Topics  . Smoking status: Never Smoker   . Smokeless tobacco: Not on file  . Alcohol Use: No   OB History   Grav Para Term Preterm Abortions TAB SAB Ect Mult Living                 Review of Systems  Constitutional: Negative for fever.  Gastrointestinal: Positive for nausea. Negative for vomiting.  Genitourinary: Positive for dysuria and flank pain. Negative for hematuria.   A complete 10 system review of systems was obtained and all systems are negative except as noted in the HPI and PMH.     Allergies  Motrin and Ultram  Home Medications   Current Outpatient Rx  Name  Route  Sig  Dispense  Refill  . Calcium Carbonate-Vitamin D (CALTRATE 600+D) 600-400 MG-UNIT per tablet   Oral    Take 2 tablets by mouth 2 (two) times daily.         . cyclobenzaprine (FLEXERIL) 10 MG tablet   Oral   Take 10 mg by mouth 3 (three) times daily as needed. For muscle spasms          . digoxin (LANOXIN) 0.25 MG tablet   Oral   Take 250 mcg by mouth daily.           Marland Kitchen esomeprazole (NEXIUM) 40 MG capsule   Oral   Take 40 mg by mouth daily before breakfast.           . HYDROcodone-acetaminophen (NORCO/VICODIN) 5-325 MG per tablet   Oral   Take 1 tablet by mouth every 6 (six) hours as needed.   10 tablet   0   . levothyroxine (SYNTHROID, LEVOTHROID) 200 MCG tablet   Oral   Take 200 mcg by mouth daily.         Marland Kitchen lisinopril (PRINIVIL,ZESTRIL) 5 MG tablet   Oral   Take 5 mg by mouth daily.         . metoprolol (TOPROL-XL) 50 MG 24 hr tablet   Oral   Take 50 mg by  mouth 2 (two) times daily.            BP 138/59  Pulse 73  Temp(Src) 98.5 F (36.9 C) (Oral)  Resp 16  Ht 4\' 11"  (1.499 m)  Wt 221 lb (100.245 kg)  BMI 44.61 kg/m2  SpO2 97% Physical Exam  Nursing note and vitals reviewed. Constitutional: She is oriented to person, place, and time. She appears well-developed and well-nourished.  HENT:  Head: Normocephalic and atraumatic.  Cerumen impaction on the right  Eyes: EOM are normal. Pupils are equal, round, and reactive to light.  Neck: Normal range of motion. Neck supple.  Cardiovascular: Normal rate, normal heart sounds and intact distal pulses.   Pulmonary/Chest: Effort normal and breath sounds normal.  Abdominal: Bowel sounds are normal. She exhibits no distension. There is tenderness (diffuse abdomen and right flank).  Musculoskeletal: Normal range of motion. She exhibits no edema and no tenderness.  Neurological: She is alert and oriented to person, place, and time. She has normal strength. No cranial nerve deficit or sensory deficit.  Skin: Skin is warm and dry. No rash noted.  Psychiatric: She has a normal mood and affect.    ED Course   Procedures (including critical care time) Medications - No data to display  DIAGNOSTIC STUDIES: Oxygen Saturation is 97% on RA, normal by my interpretation.    COORDINATION OF CARE: 10:11 PM- Discussed treatment plan with pt. Pt agrees to plan.    Labs Review Labs Reviewed  URINALYSIS, ROUTINE W REFLEX MICROSCOPIC - Abnormal; Notable for the following:    Urobilinogen, UA 2.0 (*)    All other components within normal limits   Imaging Review No results found.  EKG Interpretation   None       MDM   Final diagnoses:  Right flank pain  Cerumen impaction    UA neg. Pt has reported history of renal stones, will treat empirically. Also has cerumen impaction, colace and irrigation, some cerumen remains, advised OTC cerumen removal or ENT followup.   I personally performed the services described in this documentation, which was scribed in my presence. The recorded information has been reviewed and is accurate.      Charles B. Karle Starch, MD 10/29/13 2318

## 2013-12-16 ENCOUNTER — Encounter (HOSPITAL_BASED_OUTPATIENT_CLINIC_OR_DEPARTMENT_OTHER): Payer: Self-pay | Admitting: Emergency Medicine

## 2013-12-16 ENCOUNTER — Emergency Department (HOSPITAL_BASED_OUTPATIENT_CLINIC_OR_DEPARTMENT_OTHER): Payer: Medicaid Other

## 2013-12-16 ENCOUNTER — Emergency Department (HOSPITAL_BASED_OUTPATIENT_CLINIC_OR_DEPARTMENT_OTHER)
Admission: EM | Admit: 2013-12-16 | Discharge: 2013-12-16 | Disposition: A | Discharge status: Home / Self Care | Payer: Medicaid Other | Attending: Emergency Medicine | Admitting: Emergency Medicine

## 2013-12-16 DIAGNOSIS — X500XXA Overexertion from strenuous movement or load, initial encounter: Secondary | ICD-10-CM | POA: Insufficient documentation

## 2013-12-16 DIAGNOSIS — S82892A Other fracture of left lower leg, initial encounter for closed fracture: Secondary | ICD-10-CM

## 2013-12-16 DIAGNOSIS — Z79899 Other long term (current) drug therapy: Secondary | ICD-10-CM | POA: Insufficient documentation

## 2013-12-16 DIAGNOSIS — K219 Gastro-esophageal reflux disease without esophagitis: Secondary | ICD-10-CM | POA: Insufficient documentation

## 2013-12-16 DIAGNOSIS — Y929 Unspecified place or not applicable: Secondary | ICD-10-CM | POA: Insufficient documentation

## 2013-12-16 DIAGNOSIS — I10 Essential (primary) hypertension: Secondary | ICD-10-CM | POA: Insufficient documentation

## 2013-12-16 DIAGNOSIS — S82899A Other fracture of unspecified lower leg, initial encounter for closed fracture: Secondary | ICD-10-CM | POA: Insufficient documentation

## 2013-12-16 DIAGNOSIS — I4891 Unspecified atrial fibrillation: Secondary | ICD-10-CM | POA: Insufficient documentation

## 2013-12-16 DIAGNOSIS — Y939 Activity, unspecified: Secondary | ICD-10-CM | POA: Insufficient documentation

## 2013-12-16 DIAGNOSIS — Z87442 Personal history of urinary calculi: Secondary | ICD-10-CM | POA: Insufficient documentation

## 2013-12-16 DIAGNOSIS — E079 Disorder of thyroid, unspecified: Secondary | ICD-10-CM | POA: Insufficient documentation

## 2013-12-16 MED ORDER — HYDROCODONE-ACETAMINOPHEN 5-325 MG PO TABS: 2.0000 | ORAL_TABLET | ORAL | Status: DC | PRN

## 2013-12-16 NOTE — ED Notes (Signed)
Pt already has crutches at home and knows how to used them. RN Rod Holler informed

## 2013-12-16 NOTE — ED Provider Notes (Signed)
Medical screening examination/treatment/procedure(s) were performed by non-physician practitioner and as supervising physician I was immediately available for consultation/collaboration.   EKG Interpretation None        Mervin Kung, MD 12/16/13 2239

## 2013-12-16 NOTE — Discharge Instructions (Signed)
Ankle Fracture A fracture is a break in the bone. A cast or splint is used to protect and keep your injured bone from moving.  HOME CARE INSTRUCTIONS   Use your crutches as directed.  To lessen the swelling, keep the injured leg elevated while sitting or lying down.  Apply ice to the injury for 15-20 minutes, 03-04 times per day while awake for 2 days. Put the ice in a plastic bag and place a thin towel between the bag of ice and your cast.  If you have a plaster or fiberglass cast:  Do not try to scratch the skin under the cast using sharp or pointed objects.  Check the skin around the cast every day. You may put lotion on any red or sore areas.  Keep your cast dry and clean.  If you have a plaster splint:  Wear the splint as directed.  You may loosen the elastic around the splint if your toes become numb, tingle, or turn cold or blue.  Do not put pressure on any part of your cast or splint; it may break. Rest your cast only on a pillow the first 24 hours until it is fully hardened.  Your cast or splint can be protected during bathing with a plastic bag. Do not lower the cast or splint into water.  Take medications as directed by your caregiver. Only take over-the-counter or prescription medicines for pain, discomfort, or fever as directed by your caregiver.  Do not drive a vehicle until your caregiver specifically tells you it is safe to do so.  If your caregiver has given you a follow-up appointment, it is very important to keep that appointment. Not keeping the appointment could result in a chronic or permanent injury, pain, and disability. If there is any problem keeping the appointment, you must call back to this facility for assistance. SEEK IMMEDIATE MEDICAL CARE IF:   Your cast gets damaged or breaks.  You have continued severe pain or more swelling than you did before the cast was put on.  Your skin or toenails below the injury turn blue or gray, or feel cold or  numb.  There is a bad smell or new stains and/or purulent (pus like) drainage coming from under the cast. If you do not have a window in your cast for observing the wound, a discharge or minor bleeding may show up as a stain on the outside of your cast. Report these findings to your caregiver. MAKE SURE YOU:   Understand these instructions.  Will watch your condition.  Will get help right away if you are not doing well or get worse. Document Released: 08/29/2000 Document Revised: 11/24/2011 Document Reviewed: 03/31/2013 [facility] Patient Information 2014 [place], [place].  Cast or Splint Care Casts and splints support injured limbs and keep bones from moving while they heal.  HOME CARE  Keep the cast or splint uncovered during the drying period.  A plaster cast can take 24 to 48 hours to dry.  A fiberglass cast will dry in less than 1 hour.  Do not rest the cast on anything harder than a pillow for 24 hours.  Do not put weight on your injured limb. Do not put pressure on the cast. Wait for your doctor's approval.  Keep the cast or splint dry.  Cover the cast or splint with a plastic bag during baths or wet weather.  If you have a cast over your chest and belly (trunk), take sponge baths until the cast  is taken off.  If your cast gets wet, dry it with a towel or blow dryer. Use the cool setting on the blow dryer.  Keep your cast or splint clean. Wash a dirty cast with a damp cloth.  Do not put any objects under your cast or splint.  Do not scratch the skin under the cast with an object. If itching is a problem, use a blow dryer on a cool setting over the itchy area.  Do not trim or cut your cast.  Do not take out the padding from inside your cast.  Exercise your joints near the cast as told by your doctor.  Raise (elevate) your injured limb on 1 or 2 pillows for the first 1 to 3 days. GET HELP IF:  Your cast or splint cracks.  Your cast or splint is too tight or too  loose.  You itch badly under the cast.  Your cast gets wet or has a soft spot.  You have a bad smell coming from the cast.  You get an object stuck under the cast.  Your skin around the cast becomes red or sore.  You have new or more pain after the cast is put on. GET HELP RIGHT AWAY IF:  You have fluid leaking through the cast.  You cannot move your fingers or toes.  Your fingers or toes turn blue or white or are cool, painful, or puffy (swollen).  You have tingling or lose feeling (numbness) around the injured area.  You have bad pain or pressure under the cast.  You have trouble breathing or have shortness of breath.  You have chest pain. Document Released: 01/01/2011 Document Revised: 05/04/2013 Document Reviewed: 03/10/2013 [facility] Patient Information 2014 [place].

## 2013-12-16 NOTE — ED Notes (Signed)
Pt declined crutches because she has them at home.

## 2013-12-16 NOTE — ED Provider Notes (Signed)
CSN: [id number]     Arrival date & time 12/16/13  1948 History   None    Chief Complaint  Patient presents with  . Ankle Injury     (Consider location/radiation/quality/duration/timing/severity/associated sxs/prior Treatment) Patient is a 53 y.o. female presenting with lower extremity injury. The history is provided by the patient. No language interpreter was used.  Ankle Injury This is a new problem. The current episode started today. The problem occurs constantly. The problem has been gradually worsening. Associated symptoms include joint swelling. Nothing aggravates the symptoms. She has tried acetaminophen for the symptoms. The treatment provided moderate relief.  Pt stepped in a hole.  Pt complains of pain in her ankle  Past Medical History  Diagnosis Date  . Atrial fibrillation   . Hypertension   . Back pain   . GERD (gastroesophageal reflux disease)   . Thyroid disease   . Kidney stone    Past Surgical History  Procedure Laterality Date  . Cesarean section    . Tonsillectomy    . Appendectomy    . Breast surgery    . Thyroidectomy    . Cesarean section    . Tubal ligation     No family history on file. History  Substance Use Topics  . Smoking status: Never Smoker   . Smokeless tobacco: Not on file  . Alcohol Use: No   OB History   Grav Para Term Preterm Abortions TAB SAB Ect Mult Living                 Review of Systems  Musculoskeletal: Positive for joint swelling.  All other systems reviewed and are negative.      Allergies  Motrin and Ultram  Home Medications   Current Outpatient Rx  Name  Route  Sig  Dispense  Refill  . gabapentin (NEURONTIN) 300 MG capsule   Oral   Take 300 mg by mouth 3 (three) times daily.         . Calcium Carbonate-Vitamin D (CALTRATE 600+D) 600-400 MG-UNIT per tablet   Oral   Take 2 tablets by mouth 2 (two) times daily.         . cyclobenzaprine (FLEXERIL) 10 MG tablet   Oral   Take 10 mg by mouth 3 (three)  times daily as needed. For muscle spasms          . digoxin (LANOXIN) 0.25 MG tablet   Oral   Take 250 mcg by mouth daily.           Marland Kitchen esomeprazole (NEXIUM) 40 MG capsule   Oral   Take 40 mg by mouth daily before breakfast.           . HYDROcodone-acetaminophen (NORCO/VICODIN) 5-325 MG per tablet   Oral   Take 1 tablet by mouth every 6 (six) hours as needed.   10 tablet   0   . levothyroxine (SYNTHROID, LEVOTHROID) 200 MCG tablet   Oral   Take 200 mcg by mouth daily.         Marland Kitchen lisinopril (PRINIVIL,ZESTRIL) 5 MG tablet   Oral   Take 5 mg by mouth daily.         . metoprolol (TOPROL-XL) 50 MG 24 hr tablet   Oral   Take 50 mg by mouth 2 (two) times daily.            BP 128/51  Pulse 70  Temp(Src) 98.3 F (36.8 C) (Oral)  Resp 18  Ht 4\' 11"  (  1.499 m)  Wt 214 lb (97.07 kg)  BMI 43.20 kg/m2  SpO2 97% Physical Exam  Constitutional: She is oriented to person, place, and time. She appears well-developed. She appears distressed.  HENT:  Head: Normocephalic.  Eyes: Pupils are equal, round, and reactive to light.  Cardiovascular: Normal rate.   Pulmonary/Chest: Effort normal.  Musculoskeletal: She exhibits tenderness.  Swollen tender medial ankle,   Decreased range of motion,  nv and ns intact   Neurological: She is alert and oriented to person, place, and time. She has normal reflexes.  Skin: Skin is warm.  Psychiatric: She has a normal mood and affect.    ED Course  Procedures (including critical care time) Labs Review Labs Reviewed - No data to display Imaging Review Dg Ankle Complete Left  12/16/2013   CLINICAL DATA:  Pain post trauma  EXAM: LEFT ANKLE COMPLETE - 3+ VIEW  COMPARISON:  November 29, 2013  FINDINGS: Frontal, oblique, and lateral views were obtained. There is evidence of a small avulsion between the talus and navicular seen only on the oblique view. No other evidence of fracture. No effusion. Ankle mortise appears intact. There is a spur arising  from the inferior calcaneus.  IMPRESSION: Avulsion located between the medial talus and navicular, seen only on the oblique view. No other fracture. Mortise intact. Inferior calcaneal spur.   Electronically Signed   By: Lowella Grip M.D.   On: 12/16/2013 20:39     EKG Interpretation None      MDM   Final diagnoses:  Ankle fracture, left    Splint Crutches Hydrocodone See Dr. Barbaraann Barthel for recheck in 1 week    Fransico Meadow, PA-C 12/16/13 2236

## 2013-12-16 NOTE — ED Notes (Signed)
Stepped in hole-twisted left ankle-steady gait to triage

## 2014-04-23 ENCOUNTER — Emergency Department (HOSPITAL_BASED_OUTPATIENT_CLINIC_OR_DEPARTMENT_OTHER)
Admission: EM | Admit: 2014-04-23 | Discharge: 2014-04-23 | Disposition: A | Discharge status: Home / Self Care | Payer: Medicaid Other | Attending: Emergency Medicine | Admitting: Emergency Medicine

## 2014-04-23 ENCOUNTER — Encounter (HOSPITAL_BASED_OUTPATIENT_CLINIC_OR_DEPARTMENT_OTHER): Payer: Self-pay | Admitting: Emergency Medicine

## 2014-04-23 DIAGNOSIS — M5430 Sciatica, unspecified side: Secondary | ICD-10-CM | POA: Diagnosis not present

## 2014-04-23 DIAGNOSIS — I10 Essential (primary) hypertension: Secondary | ICD-10-CM | POA: Insufficient documentation

## 2014-04-23 DIAGNOSIS — K219 Gastro-esophageal reflux disease without esophagitis: Secondary | ICD-10-CM | POA: Diagnosis not present

## 2014-04-23 DIAGNOSIS — Z87442 Personal history of urinary calculi: Secondary | ICD-10-CM | POA: Diagnosis not present

## 2014-04-23 DIAGNOSIS — Z79899 Other long term (current) drug therapy: Secondary | ICD-10-CM | POA: Diagnosis not present

## 2014-04-23 DIAGNOSIS — M5432 Sciatica, left side: Secondary | ICD-10-CM

## 2014-04-23 DIAGNOSIS — M549 Dorsalgia, unspecified: Secondary | ICD-10-CM | POA: Insufficient documentation

## 2014-04-23 DIAGNOSIS — E079 Disorder of thyroid, unspecified: Secondary | ICD-10-CM | POA: Diagnosis not present

## 2014-04-23 DIAGNOSIS — I4891 Unspecified atrial fibrillation: Secondary | ICD-10-CM | POA: Insufficient documentation

## 2014-04-23 HISTORY — DX: Unspecified cirrhosis of liver: K74.60

## 2014-04-23 MED ORDER — DIAZEPAM 5 MG PO TABS: 5.0000 mg | ORAL_TABLET | Freq: Three times a day (TID) | ORAL | Status: AC | PRN

## 2014-04-23 MED ORDER — OXYCODONE-ACETAMINOPHEN 5-325 MG PO TABS: 1.0000 | ORAL_TABLET | Freq: Four times a day (QID) | ORAL | Status: AC | PRN

## 2014-04-23 MED ORDER — HYDROMORPHONE HCL PF 1 MG/ML IJ SOLN
1.0000 mg | Freq: Once | INTRAMUSCULAR | Status: AC
  Administered 2014-04-23: 1 mg via INTRAMUSCULAR
  Filled 2014-04-23: qty 1

## 2014-04-23 MED ORDER — DIAZEPAM 5 MG PO TABS
5.0000 mg | ORAL_TABLET | Freq: Once | ORAL | Status: AC
  Administered 2014-04-23: 5 mg via ORAL
  Filled 2014-04-23: qty 1

## 2014-04-23 NOTE — ED Provider Notes (Signed)
CSN: [id number]     Arrival date & time 04/23/14  1303 History   First MD Initiated Contact with Patient 04/23/14 1311     Chief Complaint  Patient presents with  . Back Pain     (Consider location/radiation/quality/duration/timing/severity/associated sxs/prior Treatment) Patient is a 53 y.o. female presenting with back pain. The history is provided by the patient.  Back Pain Location:  Lumbar spine Quality:  Aching, stabbing and shooting Radiates to:  L posterior upper leg Pain severity:  Moderate Pain is:  Same all the time Onset quality:  Gradual Duration:  1 day Timing:  Constant Progression:  Unchanged Chronicity:  Recurrent Context comment:  Unknown Relieved by:  Nothing Worsened by:  Bending, movement and ambulation Associated symptoms: no abdominal pain and no fever     Past Medical History  Diagnosis Date  . Atrial fibrillation   . Hypertension   . Back pain   . GERD (gastroesophageal reflux disease)   . Thyroid disease   . Kidney stone   . Cirrhosis of liver Sep. 2014   Past Surgical History  Procedure Laterality Date  . Cesarean section    . Tonsillectomy    . Appendectomy    . Breast surgery    . Thyroidectomy    . Cesarean section    . Tubal ligation    . Cholecystectomy  Sep. 2014   No family history on file. History  Substance Use Topics  . Smoking status: Never Smoker   . Smokeless tobacco: Not on file  . Alcohol Use: No   OB History   Grav Para Term Preterm Abortions TAB SAB Ect Mult Living                 Review of Systems  Constitutional: Negative for fever and chills.  Respiratory: Negative for cough and shortness of breath.   Gastrointestinal: Negative for vomiting and abdominal pain.  Musculoskeletal: Positive for back pain.  All other systems reviewed and are negative.     Allergies  Motrin and Ultram  Home Medications   Prior to Admission medications   Medication Sig Start Date End Date Taking? Authorizing Provider   Calcium Carbonate-Vitamin D (CALTRATE 600+D) 600-400 MG-UNIT per tablet Take 2 tablets by mouth 2 (two) times daily.    Historical Provider, MD  cyclobenzaprine (FLEXERIL) 10 MG tablet Take 10 mg by mouth 3 (three) times daily as needed. For muscle spasms     Historical Provider, MD  digoxin (LANOXIN) 0.25 MG tablet Take 250 mcg by mouth daily.      Historical Provider, MD  esomeprazole (NEXIUM) 40 MG capsule Take 40 mg by mouth daily before breakfast.      Historical Provider, MD  gabapentin (NEURONTIN) 300 MG capsule Take 300 mg by mouth 3 (three) times daily.    Historical Provider, MD  HYDROcodone-acetaminophen (NORCO/VICODIN) 5-325 MG per tablet Take 1 tablet by mouth every 6 (six) hours as needed. 10/29/13   Charles B. Karle Starch, MD  HYDROcodone-acetaminophen (NORCO/VICODIN) 5-325 MG per tablet Take 2 tablets by mouth every 4 (four) hours as needed. 12/16/13   Fransico Meadow, PA-C  levothyroxine (SYNTHROID, LEVOTHROID) 200 MCG tablet Take 200 mcg by mouth daily.    Historical Provider, MD  lisinopril (PRINIVIL,ZESTRIL) 5 MG tablet Take 5 mg by mouth daily.    Historical Provider, MD  metoprolol (TOPROL-XL) 50 MG 24 hr tablet Take 50 mg by mouth 2 (two) times daily.      Historical Provider, MD  BP 106/60  Pulse 84  Temp(Src) 98 F (36.7 C) (Oral)  Resp 18  Ht 4\' 11"  (1.499 m)  Wt 214 lb (97.07 kg)  BMI 43.20 kg/m2  SpO2 97% Physical Exam  Nursing note and vitals reviewed. Constitutional: She is oriented to person, place, and time. She appears well-developed and well-nourished. No distress.  HENT:  Head: Normocephalic and atraumatic.  Mouth/Throat: Oropharynx is clear and moist.  Eyes: EOM are normal. Pupils are equal, round, and reactive to light.  Neck: Normal range of motion. Neck supple.  Cardiovascular: Normal rate and regular rhythm.  Exam reveals no friction rub.   No murmur heard. Pulmonary/Chest: Effort normal and breath sounds normal. No respiratory distress. She has no  wheezes. She has no rales.  Abdominal: Soft. She exhibits no distension. There is no tenderness. There is no rebound.  Musculoskeletal: Normal range of motion. She exhibits no edema.  Neurological: She is alert and oriented to person, place, and time. No cranial nerve deficit. She exhibits normal muscle tone. Coordination normal.  Straight leg raise positive on the left  Skin: No rash noted. She is not diaphoretic.    ED Course  Procedures (including critical care time) Labs Review Labs Reviewed - No data to display  Imaging Review No results found.   EKG Interpretation None      MDM   Final diagnoses:  Left sided sciatica    79F here with back pain. L sided, radiating down into her posterior L leg, similar to sciatic nerve pain she's had in the past. No recent injury. No fevers, no vomiting. Straight leg raise positive in L leg. Normal strength and sensation.  Will give pain meds and muscle relaxers, exam c/w sciatic nerve pain.  Feeling better after meds. Ambulatory without assistance. Stable for discharge.  Evelina Bucy, MD 04/23/14 1538

## 2014-04-23 NOTE — ED Notes (Signed)
Dr. Mingo Amber made aware that pt's BP is 90/52; pt and daughter say they check her BP every day and it runs from 67Y-195K systolic; pt asymptomatic. No new orders received.

## 2014-04-23 NOTE — ED Notes (Signed)
Pt ambulated around the dept, c/o left lower back pain, states it hurts less than it did when she arrived but it is still hurting.

## 2014-04-23 NOTE — ED Notes (Signed)
Lower pain that began yesterday shooting through L hip and knee; no meds taken. States she feels like sciatic nerve that she's had in the past.

## 2014-04-23 NOTE — Discharge Instructions (Signed)
Sciatica Sciatica is pain, weakness, numbness, or tingling along the path of the sciatic nerve. The nerve starts in the lower back and runs down the back of each leg. The nerve controls the muscles in the lower leg and in the back of the knee, while also providing sensation to the back of the thigh, lower leg, and the sole of your foot. Sciatica is a symptom of another medical condition. For instance, nerve damage or certain conditions, such as a herniated disk or bone spur on the spine, pinch or put pressure on the sciatic nerve. This causes the pain, weakness, or other sensations normally associated with sciatica. Generally, sciatica only affects one side of the body. CAUSES   Herniated or slipped disc.  Degenerative disk disease.  A pain disorder involving the narrow muscle in the buttocks (piriformis syndrome).  Pelvic injury or fracture.  Pregnancy.  Tumor (rare). SYMPTOMS  Symptoms can vary from mild to very severe. The symptoms usually travel from the low back to the buttocks and down the back of the leg. Symptoms can include:  Mild tingling or dull aches in the lower back, leg, or hip.  Numbness in the back of the calf or sole of the foot.  Burning sensations in the lower back, leg, or hip.  Sharp pains in the lower back, leg, or hip.  Leg weakness.  Severe back pain inhibiting movement. These symptoms may get worse with coughing, sneezing, laughing, or prolonged sitting or standing. Also, being overweight may worsen symptoms. DIAGNOSIS  Your caregiver will perform a physical exam to look for common symptoms of sciatica. He or she may ask you to do certain movements or activities that would trigger sciatic nerve pain. Other tests may be performed to find the cause of the sciatica. These may include:  Blood tests.  X-rays.  Imaging tests, such as an MRI or CT scan. TREATMENT  Treatment is directed at the cause of the sciatic pain. Sometimes, treatment is not necessary  and the pain and discomfort goes away on its own. If treatment is needed, your caregiver may suggest:  Over-the-counter medicines to relieve pain.  Prescription medicines, such as anti-inflammatory medicine, muscle relaxants, or narcotics.  Applying heat or ice to the painful area.  Steroid injections to lessen pain, irritation, and inflammation around the nerve.  Reducing activity during periods of pain.  Exercising and stretching to strengthen your abdomen and improve flexibility of your spine. Your caregiver may suggest losing weight if the extra weight makes the back pain worse.  Physical therapy.  Surgery to eliminate what is pressing or pinching the nerve, such as a bone spur or part of a herniated disk. HOME CARE INSTRUCTIONS   Only take over-the-counter or prescription medicines for pain or discomfort as directed by your caregiver.  Apply ice to the affected area for 20 minutes, 3-4 times a day for the first 48-72 hours. Then try heat in the same way.  Exercise, stretch, or perform your usual activities if these do not aggravate your pain.  Attend physical therapy sessions as directed by your caregiver.  Keep all follow-up appointments as directed by your caregiver.  Do not wear high heels or shoes that do not provide proper support.  Check your mattress to see if it is too soft. A firm mattress may lessen your pain and discomfort. SEEK IMMEDIATE MEDICAL CARE IF:   You lose control of your bowel or bladder (incontinence).  You have increasing weakness in the lower back, pelvis, buttocks,   or legs.  You have redness or swelling of your back.  You have a burning sensation when you urinate.  You have pain that gets worse when you lie down or awakens you at night.  Your pain is worse than you have experienced in the past.  Your pain is lasting longer than 4 weeks.  You are suddenly losing weight without reason. MAKE SURE YOU:  Understand these  instructions.  Will watch your condition.  Will get help right away if you are not doing well or get worse. Document Released: 08/26/2001 Document Revised: 03/02/2012 Document Reviewed: 01/11/2012 [employer] Patient Information 2015 [employer]. This information is not intended to replace advice given to you by your health care provider. Make sure you discuss any questions you have with your health care provider.  

## 2014-05-12 ENCOUNTER — Encounter (HOSPITAL_BASED_OUTPATIENT_CLINIC_OR_DEPARTMENT_OTHER): Payer: Self-pay | Admitting: Emergency Medicine

## 2014-05-12 ENCOUNTER — Emergency Department (HOSPITAL_BASED_OUTPATIENT_CLINIC_OR_DEPARTMENT_OTHER)
Admission: EM | Admit: 2014-05-12 | Discharge: 2014-05-12 | Disposition: A | Discharge status: Home / Self Care | Payer: Medicaid Other | Attending: Emergency Medicine | Admitting: Emergency Medicine

## 2014-05-12 ENCOUNTER — Emergency Department (HOSPITAL_BASED_OUTPATIENT_CLINIC_OR_DEPARTMENT_OTHER): Payer: Medicaid Other

## 2014-05-12 DIAGNOSIS — M7989 Other specified soft tissue disorders: Secondary | ICD-10-CM | POA: Diagnosis not present

## 2014-05-12 DIAGNOSIS — M79609 Pain in unspecified limb: Secondary | ICD-10-CM | POA: Insufficient documentation

## 2014-05-12 DIAGNOSIS — I10 Essential (primary) hypertension: Secondary | ICD-10-CM | POA: Diagnosis not present

## 2014-05-12 DIAGNOSIS — Z79899 Other long term (current) drug therapy: Secondary | ICD-10-CM | POA: Insufficient documentation

## 2014-05-12 DIAGNOSIS — Z87442 Personal history of urinary calculi: Secondary | ICD-10-CM | POA: Insufficient documentation

## 2014-05-12 DIAGNOSIS — L539 Erythematous condition, unspecified: Secondary | ICD-10-CM | POA: Insufficient documentation

## 2014-05-12 DIAGNOSIS — E079 Disorder of thyroid, unspecified: Secondary | ICD-10-CM | POA: Diagnosis not present

## 2014-05-12 DIAGNOSIS — I4891 Unspecified atrial fibrillation: Secondary | ICD-10-CM | POA: Diagnosis not present

## 2014-05-12 DIAGNOSIS — K219 Gastro-esophageal reflux disease without esophagitis: Secondary | ICD-10-CM | POA: Insufficient documentation

## 2014-05-12 LAB — BASIC METABOLIC PANEL
ANION GAP: 14 (ref 5–15)
BUN: 12 mg/dL (ref 6–23)
CHLORIDE: 102 meq/L (ref 96–112)
CO2: 25 meq/L (ref 19–32)
CREATININE: 0.7 mg/dL (ref 0.50–1.10)
Calcium: 9.1 mg/dL (ref 8.4–10.5)
GFR calc Af Amer: >90 mL/min (ref >90)
GFR calc non Af Amer: >90 mL/min (ref >90)
GLUCOSE: 216 mg/dL — AB (ref 70–99)
Potassium: 3.8 mEq/L (ref 3.7–5.3)
Sodium: 141 mEq/L (ref 137–147)

## 2014-05-12 LAB — CBC WITH DIFFERENTIAL/PLATELET
BASOS ABS: 0 10*3/uL (ref 0.0–0.1)
Basophils Relative: 0 % (ref 0–1)
Eosinophils Absolute: 0.2 10*3/uL (ref 0.0–0.7)
Eosinophils Relative: 4 % (ref 0–5)
HEMATOCRIT: 38.3 % (ref 36.0–46.0)
HEMOGLOBIN: 13.7 g/dL (ref 12.0–15.0)
LYMPHS ABS: 1.4 10*3/uL (ref 0.7–4.0)
LYMPHS PCT: 26 % (ref 12–46)
MCH: 32.6 pg (ref 26.0–34.0)
MCHC: 35.8 g/dL (ref 30.0–36.0)
MCV: 91.2 fL (ref 78.0–100.0)
MONO ABS: 0.3 10*3/uL (ref 0.1–1.0)
MONOS PCT: 6 % (ref 3–12)
NEUTROS ABS: 3.4 10*3/uL (ref 1.7–7.7)
Neutrophils Relative %: 63 % (ref 43–77)
Platelets: 110 10*3/uL — ABNORMAL LOW (ref 150–400)
RBC: 4.2 MIL/uL (ref 3.87–5.11)
RDW: 13.6 % (ref 11.5–15.5)
WBC: 5.3 10*3/uL (ref 4.0–10.5)

## 2014-05-12 LAB — URIC ACID: Uric Acid, Serum: 3.6 mg/dL (ref 2.4–7.0)

## 2014-05-12 MED ORDER — HYDROCODONE-ACETAMINOPHEN 5-325 MG PO TABS: 2.0000 | ORAL_TABLET | ORAL | Status: DC | PRN

## 2014-05-12 NOTE — ED Notes (Signed)
Right hand pain. No injury.

## 2014-05-12 NOTE — Discharge Instructions (Signed)

## 2014-05-12 NOTE — ED Provider Notes (Signed)
CSN: [id number]     Arrival date & time 05/12/14  1445 History   First MD Initiated Contact with Patient 05/12/14 1622     Chief Complaint  Patient presents with  . Hand Pain     (Consider location/radiation/quality/duration/timing/severity/associated sxs/prior Treatment) Patient is a 53 y.o. female presenting with hand pain. The history is provided by the patient. No language interpreter was used.  Hand Pain This is a new problem. The current episode started in the past 7 days. The problem occurs 2 to 4 times per day. The problem has been gradually worsening. Pertinent negatives include no fever or joint swelling. Nothing aggravates the symptoms. She has tried nothing for the symptoms. The treatment provided no relief.    Past Medical History  Diagnosis Date  . Atrial fibrillation   . Hypertension   . Back pain   . GERD (gastroesophageal reflux disease)   . Thyroid disease   . Kidney stone   . Cirrhosis of liver Sep. 2014   Past Surgical History  Procedure Laterality Date  . Cesarean section    . Tonsillectomy    . Appendectomy    . Breast surgery    . Thyroidectomy    . Cesarean section    . Tubal ligation    . Cholecystectomy  Sep. 2014   No family history on file. History  Substance Use Topics  . Smoking status: Never Smoker   . Smokeless tobacco: Not on file  . Alcohol Use: No   OB History   Grav Para Term Preterm Abortions TAB SAB Ect Mult Living                 Review of Systems  Constitutional: Negative for fever.  Musculoskeletal: Negative for joint swelling.  Skin: Negative for wound.  All other systems reviewed and are negative.     Allergies  Motrin and Ultram  Home Medications   Prior to Admission medications   Medication Sig Start Date End Date Taking? Authorizing Provider  Calcium Carbonate-Vitamin D (CALTRATE 600+D) 600-400 MG-UNIT per tablet Take 2 tablets by mouth 2 (two) times daily.    Historical Provider, MD  cyclobenzaprine  (FLEXERIL) 10 MG tablet Take 10 mg by mouth 3 (three) times daily as needed. For muscle spasms     Historical Provider, MD  diazepam (VALIUM) 5 MG tablet Take 1 tablet (5 mg total) by mouth every 8 (eight) hours as needed for muscle spasms. 04/23/14   Evelina Bucy, MD  digoxin (LANOXIN) 0.25 MG tablet Take 250 mcg by mouth daily.      Historical Provider, MD  esomeprazole (NEXIUM) 40 MG capsule Take 40 mg by mouth daily before breakfast.      Historical Provider, MD  gabapentin (NEURONTIN) 300 MG capsule Take 300 mg by mouth 3 (three) times daily.    Historical Provider, MD  HYDROcodone-acetaminophen (NORCO/VICODIN) 5-325 MG per tablet Take 1 tablet by mouth every 6 (six) hours as needed. 10/29/13   Charles B. Karle Starch, MD  HYDROcodone-acetaminophen (NORCO/VICODIN) 5-325 MG per tablet Take 2 tablets by mouth every 4 (four) hours as needed. 12/16/13   Fransico Meadow, PA-C  levothyroxine (SYNTHROID, LEVOTHROID) 200 MCG tablet Take 200 mcg by mouth daily.    Historical Provider, MD  lisinopril (PRINIVIL,ZESTRIL) 5 MG tablet Take 5 mg by mouth daily.    Historical Provider, MD  metoprolol (TOPROL-XL) 50 MG 24 hr tablet Take 50 mg by mouth 2 (two) times daily.      Historical Provider,  MD  oxyCODONE-acetaminophen (PERCOCET) 5-325 MG per tablet Take 1 tablet by mouth every 6 (six) hours as needed for moderate pain. 04/23/14   Evelina Bucy, MD   BP 120/55  Pulse 86  Temp(Src) 97.9 F (36.6 C) (Oral)  Resp 20  Ht 4\' 11"  (1.499 m)  Wt 210 lb (95.255 kg)  BMI 42.39 kg/m2  SpO2 99% Physical Exam  Nursing note and vitals reviewed. Constitutional: She is oriented to person, place, and time. She appears well-developed and well-nourished.  Musculoskeletal: She exhibits tenderness.  Neurological: She is alert and oriented to person, place, and time. She has normal reflexes.  Skin: There is erythema.  Psychiatric: She has a normal mood and affect.    ED Course  Procedures (including critical care time) Labs  Review Labs Reviewed - No data to display  Imaging Review Dg Hand Complete Right  05/12/2014   CLINICAL DATA:  Pain.  EXAM: RIGHT HAND - COMPLETE 3+ VIEW  COMPARISON:  04/13/2014.  FINDINGS: No acute soft tissue bony abnormality. Diffuse degenerative change. Diffuse mild osteopenia.  IMPRESSION: 1. No acute abnormality. 2. Diffuse degenerative change of mild osteopenia.   Electronically Signed   By: Marcello Moores  Register   On: 05/12/2014 15:52     EKG Interpretation None      Results for orders placed during the hospital encounter of 05/12/14  CBC WITH DIFFERENTIAL      Result Value Ref Range   WBC 5.3  4.0 - 10.5 K/uL   RBC 4.20  3.87 - 5.11 MIL/uL   Hemoglobin 13.7  12.0 - 15.0 g/dL   HCT 38.3  36.0 - 46.0 %   MCV 91.2  78.0 - 100.0 fL   MCH 32.6  26.0 - 34.0 pg   MCHC 35.8  30.0 - 36.0 g/dL   RDW 13.6  11.5 - 15.5 %   Platelets 110 (*) 150 - 400 K/uL   Neutrophils Relative % 63  43 - 77 %   Neutro Abs 3.4  1.7 - 7.7 K/uL   Lymphocytes Relative 26  12 - 46 %   Lymphs Abs 1.4  0.7 - 4.0 K/uL   Monocytes Relative 6  3 - 12 %   Monocytes Absolute 0.3  0.1 - 1.0 K/uL   Eosinophils Relative 4  0 - 5 %   Eosinophils Absolute 0.2  0.0 - 0.7 K/uL   Basophils Relative 0  0 - 1 %   Basophils Absolute 0.0  0.0 - 0.1 K/uL  BASIC METABOLIC PANEL      Result Value Ref Range   Sodium 141  137 - 147 mEq/L   Potassium 3.8  3.7 - 5.3 mEq/L   Chloride 102  96 - 112 mEq/L   CO2 25  19 - 32 mEq/L   Glucose, Bld 216 (*) 70 - 99 mg/dL   BUN 12  6 - 23 mg/dL   Creatinine, Ser 0.70  0.50 - 1.10 mg/dL   Calcium 9.1  8.4 - 10.5 mg/dL   GFR calc non Af Amer >90  >90 mL/min   GFR calc Af Amer >90  >90 mL/min   Anion gap 14  5 - 15  URIC ACID      Result Value Ref Range   Uric Acid, Serum 3.6  2.4 - 7.0 mg/dL   Dg Hand Complete Right  05/12/2014   CLINICAL DATA:  Pain.  EXAM: RIGHT HAND - COMPLETE 3+ VIEW  COMPARISON:  04/13/2014.  FINDINGS: No acute soft tissue  bony abnormality. Diffuse  degenerative change. Diffuse mild osteopenia.  IMPRESSION: 1. No acute abnormality. 2. Diffuse degenerative change of mild osteopenia.   Electronically Signed   By: Marcello Moores  Register   On: 05/12/2014 15:52     MDM   Final diagnoses:  Swelling of right hand   Pt counseled on symptoms.   I suspect arthritis.  Pt can not take nsaids.   I will treat her pain.   Pt is advised to follow up with her MD for recheck    Fransico Meadow, PA-C 05/13/14 1336

## 2014-05-13 NOTE — ED Provider Notes (Signed)
Medical screening examination/treatment/procedure(s) were performed by non-physician practitioner and as supervising physician I was immediately available for consultation/collaboration.   EKG Interpretation None        Evelina Bucy, MD 05/13/14 1534

## 2014-05-27 ENCOUNTER — Emergency Department (HOSPITAL_BASED_OUTPATIENT_CLINIC_OR_DEPARTMENT_OTHER)
Admission: EM | Admit: 2014-05-27 | Discharge: 2014-05-27 | Disposition: A | Discharge status: Home / Self Care | Payer: Medicaid Other | Attending: Emergency Medicine | Admitting: Emergency Medicine

## 2014-05-27 ENCOUNTER — Encounter (HOSPITAL_BASED_OUTPATIENT_CLINIC_OR_DEPARTMENT_OTHER): Payer: Self-pay | Admitting: Emergency Medicine

## 2014-05-27 ENCOUNTER — Emergency Department (HOSPITAL_BASED_OUTPATIENT_CLINIC_OR_DEPARTMENT_OTHER): Payer: Medicaid Other

## 2014-05-27 DIAGNOSIS — Y939 Activity, unspecified: Secondary | ICD-10-CM | POA: Insufficient documentation

## 2014-05-27 DIAGNOSIS — Z79899 Other long term (current) drug therapy: Secondary | ICD-10-CM | POA: Insufficient documentation

## 2014-05-27 DIAGNOSIS — K219 Gastro-esophageal reflux disease without esophagitis: Secondary | ICD-10-CM | POA: Diagnosis not present

## 2014-05-27 DIAGNOSIS — S93401A Sprain of unspecified ligament of right ankle, initial encounter: Secondary | ICD-10-CM

## 2014-05-27 DIAGNOSIS — R296 Repeated falls: Secondary | ICD-10-CM | POA: Insufficient documentation

## 2014-05-27 DIAGNOSIS — S93409A Sprain of unspecified ligament of unspecified ankle, initial encounter: Secondary | ICD-10-CM | POA: Diagnosis not present

## 2014-05-27 DIAGNOSIS — S8990XA Unspecified injury of unspecified lower leg, initial encounter: Secondary | ICD-10-CM | POA: Diagnosis present

## 2014-05-27 DIAGNOSIS — E079 Disorder of thyroid, unspecified: Secondary | ICD-10-CM | POA: Diagnosis not present

## 2014-05-27 DIAGNOSIS — S99929A Unspecified injury of unspecified foot, initial encounter: Secondary | ICD-10-CM

## 2014-05-27 DIAGNOSIS — S99919A Unspecified injury of unspecified ankle, initial encounter: Secondary | ICD-10-CM

## 2014-05-27 DIAGNOSIS — I10 Essential (primary) hypertension: Secondary | ICD-10-CM | POA: Diagnosis not present

## 2014-05-27 DIAGNOSIS — S9000XA Contusion of unspecified ankle, initial encounter: Secondary | ICD-10-CM | POA: Insufficient documentation

## 2014-05-27 DIAGNOSIS — Y929 Unspecified place or not applicable: Secondary | ICD-10-CM | POA: Diagnosis not present

## 2014-05-27 DIAGNOSIS — S9001XA Contusion of right ankle, initial encounter: Secondary | ICD-10-CM

## 2014-05-27 DIAGNOSIS — Z87442 Personal history of urinary calculi: Secondary | ICD-10-CM | POA: Insufficient documentation

## 2014-05-27 MED ORDER — HYDROCODONE-ACETAMINOPHEN 5-325 MG PO TABS: 2.0000 | ORAL_TABLET | ORAL | Status: DC | PRN

## 2014-05-27 NOTE — Discharge Instructions (Signed)

## 2014-05-27 NOTE — ED Notes (Signed)
Pt reports fell last night, pain to left ankle. No obvious deformity, ambulatory in triage.

## 2014-05-27 NOTE — ED Notes (Signed)
PT discharged to home with family. NAD. 

## 2014-05-27 NOTE — ED Provider Notes (Signed)
CSN: [id number]     Arrival date & time 05/27/14  1431 History   First MD Initiated Contact with Patient 05/27/14 1614     Chief Complaint  Patient presents with  . Ankle Pain     (Consider location/radiation/quality/duration/timing/severity/associated sxs/prior Treatment) Patient is a 53 y.o. female presenting with ankle pain. The history is provided by the patient. No language interpreter was used.  Ankle Pain Location:  Ankle and foot Injury: no   Ankle location:  L ankle Foot location:  L foot Pain details:    Quality:  Aching   Severity:  Moderate   Onset quality:  Gradual   Duration:  1 day   Timing:  Constant Chronicity:  New Dislocation: no   Relieved by:  None tried Worsened by:  Nothing tried Associated symptoms: swelling   Risk factors: no concern for non-accidental trauma     Past Medical History  Diagnosis Date  . Atrial fibrillation   . Hypertension   . Back pain   . GERD (gastroesophageal reflux disease)   . Thyroid disease   . Kidney stone   . Cirrhosis of liver Sep. 2014   Past Surgical History  Procedure Laterality Date  . Cesarean section    . Tonsillectomy    . Appendectomy    . Breast surgery    . Thyroidectomy    . Cesarean section    . Tubal ligation    . Cholecystectomy  Sep. 2014   No family history on file. History  Substance Use Topics  . Smoking status: Never Smoker   . Smokeless tobacco: Not on file  . Alcohol Use: No   OB History   Grav Para Term Preterm Abortions TAB SAB Ect Mult Living                 Review of Systems  Musculoskeletal: Positive for joint swelling and myalgias.  All other systems reviewed and are negative.     Allergies  Motrin and Ultram  Home Medications   Prior to Admission medications   Medication Sig Start Date End Date Taking? Authorizing Provider  Calcium Carbonate-Vitamin D (CALTRATE 600+D) 600-400 MG-UNIT per tablet Take 2 tablets by mouth 2 (two) times daily.    Historical Provider,  MD  cyclobenzaprine (FLEXERIL) 10 MG tablet Take 10 mg by mouth 3 (three) times daily as needed. For muscle spasms     Historical Provider, MD  diazepam (VALIUM) 5 MG tablet Take 1 tablet (5 mg total) by mouth every 8 (eight) hours as needed for muscle spasms. 04/23/14   Evelina Bucy, MD  digoxin (LANOXIN) 0.25 MG tablet Take 250 mcg by mouth daily.      Historical Provider, MD  esomeprazole (NEXIUM) 40 MG capsule Take 40 mg by mouth daily before breakfast.      Historical Provider, MD  gabapentin (NEURONTIN) 300 MG capsule Take 300 mg by mouth 3 (three) times daily.    Historical Provider, MD  HYDROcodone-acetaminophen (NORCO/VICODIN) 5-325 MG per tablet Take 1 tablet by mouth every 6 (six) hours as needed. 10/29/13   Charles B. Karle Starch, MD  HYDROcodone-acetaminophen (NORCO/VICODIN) 5-325 MG per tablet Take 2 tablets by mouth every 4 (four) hours as needed. 05/12/14   Fransico Meadow, PA-C  levothyroxine (SYNTHROID, LEVOTHROID) 200 MCG tablet Take 200 mcg by mouth daily.    Historical Provider, MD  lisinopril (PRINIVIL,ZESTRIL) 5 MG tablet Take 5 mg by mouth daily.    Historical Provider, MD  metoprolol (TOPROL-XL) 50 MG 24  hr tablet Take 50 mg by mouth 2 (two) times daily.      Historical Provider, MD  oxyCODONE-acetaminophen (PERCOCET) 5-325 MG per tablet Take 1 tablet by mouth every 6 (six) hours as needed for moderate pain. 04/23/14   Evelina Bucy, MD   BP 131/43  Pulse 90  Temp(Src) 97.7 F (36.5 C) (Oral)  Resp 20  Ht 4\' 11"  (1.499 m)  Wt 210 lb (95.255 kg)  BMI 42.39 kg/m2  SpO2 98% Physical Exam  Vitals reviewed. Constitutional: She appears well-developed and well-nourished.  Musculoskeletal: She exhibits tenderness.  Tender bruised right ankle.  Pain with movement.  Neurological: She is alert.  Skin: Skin is warm.  Psychiatric: She has a normal mood and affect.    ED Course  Procedures (including critical care time) Labs Review Labs Reviewed - No data to display  Imaging  Review Dg Ankle Complete Left  05/27/2014   CLINICAL DATA:  Twisted left ankle  EXAM: LEFT ANKLE COMPLETE - 3+ VIEW  COMPARISON:  12/16/2013  FINDINGS: There is no evidence of fracture, dislocation, or joint effusion. Small plantar heel spur. There is no evidence of arthropathy or other focal bone abnormality. Soft tissues are unremarkable.  IMPRESSION: 1. No acute findings. 2. Plantar heel spur .   Electronically Signed   By: Kerby Moors M.D.   On: 05/27/2014 15:32     EKG Interpretation None      MDM   Final diagnoses:  Ankle sprain, right, initial encounter  Contusion of right ankle, initial encounter    Hydrocodone Ace wrap/post op [place], PA-C 05/27/14 1705

## 2014-06-02 NOTE — ED Provider Notes (Signed)
Medical screening examination/treatment/procedure(s) were performed by non-physician practitioner and as supervising physician I was immediately available for consultation/collaboration.   EKG Interpretation None         Tanna Furry, MD 06/02/14 0002

## 2014-06-10 ENCOUNTER — Emergency Department (HOSPITAL_BASED_OUTPATIENT_CLINIC_OR_DEPARTMENT_OTHER): Payer: Medicaid Other

## 2014-06-10 ENCOUNTER — Encounter (HOSPITAL_BASED_OUTPATIENT_CLINIC_OR_DEPARTMENT_OTHER): Payer: Self-pay | Admitting: Emergency Medicine

## 2014-06-10 ENCOUNTER — Emergency Department (HOSPITAL_BASED_OUTPATIENT_CLINIC_OR_DEPARTMENT_OTHER)
Admission: EM | Admit: 2014-06-10 | Discharge: 2014-06-10 | Disposition: A | Discharge status: Home / Self Care | Payer: Medicaid Other | Attending: Emergency Medicine | Admitting: Emergency Medicine

## 2014-06-10 DIAGNOSIS — S5002XA Contusion of left elbow, initial encounter: Secondary | ICD-10-CM

## 2014-06-10 DIAGNOSIS — S46909A Unspecified injury of unspecified muscle, fascia and tendon at shoulder and upper arm level, unspecified arm, initial encounter: Secondary | ICD-10-CM | POA: Diagnosis present

## 2014-06-10 DIAGNOSIS — S4980XA Other specified injuries of shoulder and upper arm, unspecified arm, initial encounter: Secondary | ICD-10-CM | POA: Insufficient documentation

## 2014-06-10 DIAGNOSIS — W010XXA Fall on same level from slipping, tripping and stumbling without subsequent striking against object, initial encounter: Secondary | ICD-10-CM | POA: Insufficient documentation

## 2014-06-10 DIAGNOSIS — Z87442 Personal history of urinary calculi: Secondary | ICD-10-CM | POA: Diagnosis not present

## 2014-06-10 DIAGNOSIS — K219 Gastro-esophageal reflux disease without esophagitis: Secondary | ICD-10-CM | POA: Diagnosis not present

## 2014-06-10 DIAGNOSIS — Z79899 Other long term (current) drug therapy: Secondary | ICD-10-CM | POA: Insufficient documentation

## 2014-06-10 DIAGNOSIS — Y92009 Unspecified place in unspecified non-institutional (private) residence as the place of occurrence of the external cause: Secondary | ICD-10-CM | POA: Diagnosis not present

## 2014-06-10 DIAGNOSIS — I10 Essential (primary) hypertension: Secondary | ICD-10-CM | POA: Diagnosis not present

## 2014-06-10 DIAGNOSIS — Z8669 Personal history of other diseases of the nervous system and sense organs: Secondary | ICD-10-CM | POA: Insufficient documentation

## 2014-06-10 DIAGNOSIS — S5010XA Contusion of unspecified forearm, initial encounter: Secondary | ICD-10-CM | POA: Diagnosis not present

## 2014-06-10 DIAGNOSIS — Y9389 Activity, other specified: Secondary | ICD-10-CM | POA: Insufficient documentation

## 2014-06-10 DIAGNOSIS — I4891 Unspecified atrial fibrillation: Secondary | ICD-10-CM | POA: Diagnosis not present

## 2014-06-10 DIAGNOSIS — W19XXXA Unspecified fall, initial encounter: Secondary | ICD-10-CM

## 2014-06-10 DIAGNOSIS — E079 Disorder of thyroid, unspecified: Secondary | ICD-10-CM | POA: Diagnosis not present

## 2014-06-10 HISTORY — DX: Carpal tunnel syndrome, unspecified upper limb: G56.00

## 2014-06-10 MED ORDER — HYDROCODONE-ACETAMINOPHEN 5-325 MG PO TABS: ORAL_TABLET | ORAL | Status: DC

## 2014-06-10 MED ORDER — HYDROCODONE-ACETAMINOPHEN 5-325 MG PO TABS
1.0000 | ORAL_TABLET | Freq: Once | ORAL | Status: AC
  Administered 2014-06-10: 1 via ORAL
  Filled 2014-06-10: qty 1

## 2014-06-10 NOTE — ED Notes (Signed)
Pt reports falling on her (L) elbow this morning.  Pt can move arm-increased pain.  2+ (L) radial pulse.  Redness and swelling noted.

## 2014-06-10 NOTE — Discharge Instructions (Signed)
Rest, Ice intermittently (in the first 24-48 hours), Gentle compression with an Ace wrap, and elevate (Limb above the level of the heart)   Take up to 800mg  of ibuprofen (that is usually 4 over the counter pills)  3 times a day for 5 days. Take with food.  Please follow with your primary care doctor in the next 2 days for a check-up. They must obtain records for further management.   Do not hesitate to return to the Emergency Department for any new, worsening or concerning symptoms.  Only use the arm sling for up to 2 days. Take the arm out and rotate the shoulder every 4 hours.   Contusion A contusion is a deep bruise. Contusions are the result of an injury that caused bleeding under the skin. The contusion may turn blue, purple, or yellow. Minor injuries will give you a painless contusion, but more severe contusions may stay painful and swollen for a few weeks.  CAUSES  A contusion is usually caused by a blow, trauma, or direct force to an area of the body. SYMPTOMS   Swelling and redness of the injured area.  Bruising of the injured area.  Tenderness and soreness of the injured area.  Pain. DIAGNOSIS  The diagnosis can be made by taking a history and physical exam. An X-ray, CT scan, or MRI may be needed to determine if there were any associated injuries, such as fractures. TREATMENT  Specific treatment will depend on what area of the body was injured. In general, the best treatment for a contusion is resting, icing, elevating, and applying cold compresses to the injured area. Over-the-counter medicines may also be recommended for pain control. Ask your caregiver what the best treatment is for your contusion. HOME CARE INSTRUCTIONS   Put ice on the injured area.  Put ice in a plastic bag.  Place a towel between your skin and the bag.  Leave the ice on for 15-20 minutes, 3-4 times a day, or as directed by your health care provider.  Only take over-the-counter or prescription  medicines for pain, discomfort, or fever as directed by your caregiver. Your caregiver may recommend avoiding anti-inflammatory medicines (aspirin, ibuprofen, and naproxen) for 48 hours because these medicines may increase bruising.  Rest the injured area.  If possible, elevate the injured area to reduce swelling. SEEK IMMEDIATE MEDICAL CARE IF:   You have increased bruising or swelling.  You have pain that is getting worse.  Your swelling or pain is not relieved with medicines. MAKE SURE YOU:   Understand these instructions.  Will watch your condition.  Will get help right away if you are not doing well or get worse. Document Released: 06/11/2005 Document Revised: 09/06/2013 Document Reviewed: 07/07/2011 [facility] Patient Information 2015 [place], [place]. This information is not intended to replace advice given to you by your health care provider. Make sure you discuss any questions you have with your health care provider.

## 2014-06-10 NOTE — ED Provider Notes (Signed)
CSN: [id number]     Arrival date & time 06/10/14  1523 History   First MD Initiated Contact with Patient 06/10/14 1721     This chart was scribed for non-physician practitioner, Monico Blitz PA-C working with Charlesetta Shanks, MD by Forrestine Him, ED Scribe. This patient was seen in room MH11/MH11 and the patient's care was started at 5:24 PM.   Chief Complaint  Patient presents with  . Arm Injury   HPI HPI Comments: Lori Case is a R handed dominant  53 y.o. female with a PMHx of A-Fib, HTN, GERD, and thyroid disease who presents to the Emergency Department complaining of a L elbow injury sustained earlier this morning. Pt states she slipped down backwards on a slippery stairway. She denies any head trauma or LOC. She has not tried any OTC medications to help manage symptoms. No fever or chills. No weakness, numbness, or loss of sensation. She has no other pertinent past medical history. No other concerns this visit.   Past Medical History  Diagnosis Date  . Atrial fibrillation   . Hypertension   . Back pain   . GERD (gastroesophageal reflux disease)   . Thyroid disease   . Kidney stone   . Cirrhosis of liver Sep. 2014  . Carpal tunnel syndrome    Past Surgical History  Procedure Laterality Date  . Cesarean section    . Tonsillectomy    . Appendectomy    . Breast surgery    . Thyroidectomy    . Cesarean section    . Tubal ligation    . Cholecystectomy  Sep. 2014   History reviewed. No pertinent family history. History  Substance Use Topics  . Smoking status: Never Smoker   . Smokeless tobacco: Not on file  . Alcohol Use: No   OB History   Grav Para Term Preterm Abortions TAB SAB Ect Mult Living                 Review of Systems  A complete 10 system review of systems was obtained and all systems are negative except as noted in the HPI and PMH.    Allergies  Motrin and Ultram  Home Medications   Prior to Admission medications   Medication Sig Start Date End  Date Taking? Authorizing Provider  pregabalin (LYRICA) 100 MG capsule Take 100 mg by mouth 2 (two) times daily.   Yes Historical Provider, MD  Calcium Carbonate-Vitamin D (CALTRATE 600+D) 600-400 MG-UNIT per tablet Take 2 tablets by mouth 2 (two) times daily.    Historical Provider, MD  cyclobenzaprine (FLEXERIL) 10 MG tablet Take 10 mg by mouth 3 (three) times daily as needed. For muscle spasms     Historical Provider, MD  diazepam (VALIUM) 5 MG tablet Take 1 tablet (5 mg total) by mouth every 8 (eight) hours as needed for muscle spasms. 04/23/14   Evelina Bucy, MD  digoxin (LANOXIN) 0.25 MG tablet Take 250 mcg by mouth daily.      Historical Provider, MD  esomeprazole (NEXIUM) 40 MG capsule Take 40 mg by mouth daily before breakfast.      Historical Provider, MD  gabapentin (NEURONTIN) 300 MG capsule Take 300 mg by mouth 3 (three) times daily.    Historical Provider, MD  HYDROcodone-acetaminophen (NORCO/VICODIN) 5-325 MG per tablet Take 1 tablet by mouth every 6 (six) hours as needed. 10/29/13   Charles B. Karle Starch, MD  HYDROcodone-acetaminophen (NORCO/VICODIN) 5-325 MG per tablet Take 2 tablets by mouth every 4 (four)  hours as needed. 05/27/14   Fransico Meadow, PA-C  levothyroxine (SYNTHROID, LEVOTHROID) 200 MCG tablet Take 200 mcg by mouth daily.    Historical Provider, MD  lisinopril (PRINIVIL,ZESTRIL) 5 MG tablet Take 5 mg by mouth daily.    Historical Provider, MD  metoprolol (TOPROL-XL) 50 MG 24 hr tablet Take 50 mg by mouth 2 (two) times daily.      Historical Provider, MD  oxyCODONE-acetaminophen (PERCOCET) 5-325 MG per tablet Take 1 tablet by mouth every 6 (six) hours as needed for moderate pain. 04/23/14   Evelina Bucy, MD   Triage Vitals: BP 106/62  Pulse 88  Temp(Src) 98.3 F (36.8 C) (Oral)  Resp 20  SpO2 100%   Physical Exam  Nursing note and vitals reviewed. Constitutional: She is oriented to person, place, and time. She appears well-developed and well-nourished. No distress.   HENT:  Head: Normocephalic.  Eyes: Conjunctivae and EOM are normal.  Cardiovascular: Normal rate.   Pulmonary/Chest: Effort normal and breath sounds normal. No stridor.  Abdominal: Soft. Bowel sounds are normal.  Musculoskeletal: Normal range of motion.  Full range of motion to left elbow. No bony tenderness to palpation. There is a 3 cm contusion to the ulnar aspects of the proximal forearm. Wrist with no tenderness palpation, no snuffbox tenderness, distally neurovascularly intact. Shoulder with full range of motion, drop arm negative, no focal tenderness to palpation  Neurological: She is alert and oriented to person, place, and time.  Psychiatric: She has a normal mood and affect.    ED Course  Procedures (including critical care time)  DIAGNOSTIC STUDIES: Oxygen Saturation is 100% on RA, Normal by my interpretation.    COORDINATION OF CARE: 5:26 PM- Will order X-Ray. Discussed treatment plan with pt at bedside and pt agreed to plan.     Labs Review Labs Reviewed - No data to display  Imaging Review Dg Elbow Complete Left  06/10/2014   CLINICAL DATA:  [place] this morning. Injury of the elbow. Pain is posterior. Numbness and tingling in fingers.  EXAM: LEFT ELBOW - COMPLETE 3+ VIEW  COMPARISON:  None.  FINDINGS: There is degenerative change in the elbow. No evidence for acute fracture or dislocation. No joint effusion.  IMPRESSION: No evidence for acute  abnormality.   Electronically Signed   By: Shon Hale M.D.   On: 06/10/2014 16:41     EKG Interpretation None      MDM   Final diagnoses:  Left elbow contusion, initial encounter  Fall at home, initial encounter    Filed Vitals:   06/10/14 1531 06/10/14 1733  BP: 106/62 111/40  Pulse: 88 80  Temp: 98.3 F (36.8 C)   TempSrc: Oral   Resp: 20 18  SpO2: 100% 98%    Medications  HYDROcodone-acetaminophen (NORCO/VICODIN) 5-325 MG per tablet 1 tablet (1 tablet Oral Given 06/10/14 1752)    Lori Case is a 53  y.o. female presenting with left elbow pain status post slip and fall, excellent range of motion, patient has a contusion. X-ray is negative. Patient will be given sling and advised ice transitioning to heat over the next few days.  Evaluation does not show pathology that would require ongoing emergent intervention or inpatient treatment. Pt is hemodynamically stable and mentating appropriately. Discussed findings and plan with patient/guardian, who agrees with care plan. All questions answered. Return precautions discussed and outpatient follow up given.   Discharge Medication List as of 06/10/2014  5:37 PM    START taking these  medications   Details  !! HYDROcodone-acetaminophen (NORCO/VICODIN) 5-325 MG per tablet Take 1-2 tablets by mouth every 6 hours as needed for pain., Print     !! - Potential duplicate medications found. Please discuss with provider.        I personally performed the services described in this documentation, which was scribed in my presence. The recorded information has been reviewed and is accurate.      Monico Blitz, PA-C 06/11/14 0302

## 2014-06-11 NOTE — ED Provider Notes (Signed)
Medical screening examination/treatment/procedure(s) were performed by non-physician practitioner and as supervising physician I was immediately available for consultation/collaboration.   EKG Interpretation None       Charlesetta Shanks, MD 06/11/14 [phone]

## 2014-06-22 ENCOUNTER — Emergency Department (HOSPITAL_BASED_OUTPATIENT_CLINIC_OR_DEPARTMENT_OTHER)
Admission: EM | Admit: 2014-06-22 | Discharge: 2014-06-22 | Disposition: A | Discharge status: Home / Self Care | Payer: Medicaid Other | Attending: Emergency Medicine | Admitting: Emergency Medicine

## 2014-06-22 ENCOUNTER — Encounter (HOSPITAL_BASED_OUTPATIENT_CLINIC_OR_DEPARTMENT_OTHER): Payer: Self-pay | Admitting: Emergency Medicine

## 2014-06-22 DIAGNOSIS — Z8669 Personal history of other diseases of the nervous system and sense organs: Secondary | ICD-10-CM | POA: Diagnosis not present

## 2014-06-22 DIAGNOSIS — I10 Essential (primary) hypertension: Secondary | ICD-10-CM | POA: Insufficient documentation

## 2014-06-22 DIAGNOSIS — K219 Gastro-esophageal reflux disease without esophagitis: Secondary | ICD-10-CM | POA: Insufficient documentation

## 2014-06-22 DIAGNOSIS — Z79899 Other long term (current) drug therapy: Secondary | ICD-10-CM | POA: Insufficient documentation

## 2014-06-22 DIAGNOSIS — Z87442 Personal history of urinary calculi: Secondary | ICD-10-CM | POA: Insufficient documentation

## 2014-06-22 DIAGNOSIS — I4891 Unspecified atrial fibrillation: Secondary | ICD-10-CM | POA: Diagnosis not present

## 2014-06-22 DIAGNOSIS — E079 Disorder of thyroid, unspecified: Secondary | ICD-10-CM | POA: Insufficient documentation

## 2014-06-22 DIAGNOSIS — K0889 Other specified disorders of teeth and supporting structures: Secondary | ICD-10-CM

## 2014-06-22 DIAGNOSIS — K088 Other specified disorders of teeth and supporting structures: Secondary | ICD-10-CM | POA: Insufficient documentation

## 2014-06-22 MED ORDER — HYDROCODONE-ACETAMINOPHEN 5-325 MG PO TABS: 2.0000 | ORAL_TABLET | ORAL | Status: DC | PRN

## 2014-06-22 NOTE — ED Notes (Signed)
Toothache since yesterday 

## 2014-06-22 NOTE — Discharge Instructions (Signed)
Dental Pain °A tooth ache may be caused by cavities (tooth decay). Cavities expose the nerve of the tooth to air and hot or cold temperatures. It may come from an infection or abscess (also called a boil or furuncle) around your tooth. It is also often caused by dental caries (tooth decay). This causes the pain you are having. °DIAGNOSIS  °Your caregiver can diagnose this problem by exam. °TREATMENT  °· If caused by an infection, it may be treated with medications which kill germs (antibiotics) and pain medications as prescribed by your caregiver. Take medications as directed. °· Only take over-the-counter or prescription medicines for pain, discomfort, or fever as directed by your caregiver. °· Whether the tooth ache today is caused by infection or dental disease, you should see your dentist as soon as possible for further care. °SEEK MEDICAL CARE IF: °The exam and treatment you received today has been provided on an emergency basis only. This is not a substitute for complete medical or dental care. If your problem worsens or new problems (symptoms) appear, and you are unable to meet with your dentist, call or return to this location. °SEEK IMMEDIATE MEDICAL CARE IF:  °· You have a fever. °· You develop redness and swelling of your face, jaw, or neck. °· You are unable to open your mouth. °· You have severe pain uncontrolled by pain medicine. °MAKE SURE YOU:  °· Understand these instructions. °· Will watch your condition. °· Will get help right away if you are not doing well or get worse. °Document Released: 09/01/2005 Document Revised: 11/24/2011 Document Reviewed: 04/19/2008 °ExitCare® Patient Information ©2015 [employer]. This information is not intended to replace advice given to you by your health care provider. Make sure you discuss any questions you have with your health care provider. ° °Emergency Department Resource Guide °1) Find a Doctor and Pay Out of Pocket °Although you won't have to find out who  is covered by your insurance plan, it is a good idea to ask around and get recommendations. You will then need to call the office and see if the doctor you have chosen will accept you as a new patient and what types of options they offer for patients who are self-pay. Some doctors offer discounts or will set up payment plans for their patients who do not have insurance, but you will need to ask so you aren't surprised when you get to your appointment. ° °2) Contact Your Local Health Department °Not all health departments have doctors that can see patients for sick visits, but many do, so it is worth a call to see if yours does. If you don't know where your local health department is, you can check in your phone book. The CDC also has a tool to help you locate your state's health department, and many state websites also have listings of all of their local health departments. ° °3) Find a Walk-in Clinic °If your illness is not likely to be very severe or complicated, you may want to try a walk in clinic. These are popping up all over the country in pharmacies, drugstores, and shopping centers. They're usually staffed by nurse practitioners or physician assistants that have been trained to treat common illnesses and complaints. They're usually fairly quick and inexpensive. However, if you have serious medical issues or chronic medical problems, these are probably not your best option. ° °No Primary Care Doctor: °- Call [facility] at  832-8000 - they can help you locate a primary   care doctor that  accepts your insurance, provides certain services, etc. °- Physician Referral Service- 1-800-533-3463 ° °Chronic Pain Problems: °Organization         Address  Phone   Notes  °[facility]  (336) 297-2271 Patients need to be referred by their primary care doctor.  ° °Medication Assistance: °Organization         Address  Phone   Notes  °[place] [facility] [address]  [address] °[place], [place] [address] (336) 641-8030 --Must be a resident of [place] °-- Must have NO insurance coverage whatsoever (no Medicaid/ Medicare, etc.) °-- The pt. MUST have a primary care doctor that directs their care regularly and follows them in the community °  °[facility]  (866) 331-1348   °[employer]  (888) 892-1162   ° °Agencies that provide inexpensive medical care: °Organization         Address  Phone   Notes  °[facility]  (336) 832-8035   °[facility]    (336) 832-7272   °[facility] [address] °[place], [place] [address] (336) 832-4777   °[facility] [address], °[place] (336) 271-4999   °[facility]    (336) 373-0678   °[facility]    (336) 272-1050   °[facility] ° [address], [place] Phone:  (336) 832-4444, Fax:  (336) 832-4440 Hours of Operation:  9 am - 6 pm, M-F.  Also accepts Medicaid/Medicare and self-pay.  °[facility] ° [address], [place] Phone: (336) 832-3150, Fax: (336) 832-3151. Hours of Operation:  8:30 am - 5:30 pm, M-F.  Also accepts Medicaid and self-pay.  °[facility] [place] [address], [place] Phone: (336) 878-6027   °[facility] [address], [place],  (336)723-1848, Ext. 123 Mondays & Thursdays: 7-9 AM.  First 15 patients are seen on a first come, first serve basis. °  ° °Medicaid-accepting [place] Providers: ° °Organization         Address  Phone   Notes  °[facility] [address], [place] (336) 641-2100 Also accepts self-pay patients.  °[facility] [address], [place] ° (336) 856-9996   °[facility] [address], [place] (336) 288-8857   °[facility] [address] [place] [address], [place] (336) 299-7000   °Veita Bland [address], [place]  ° (336) 373-1557 Only accepts [place] Access Medicaid patients after they have their name applied to their card.  ° °Self-Pay (no insurance) in [place]: ° °Organization         Address  Phone   Notes  °Sickle Cell Patients, [facility] [address], [place] (336) 832-1970   °[facility] [address], [place] (336) 832-4400   °[facility] ° [address], [place] (336) 992-4800   °[facility]/Dr. Osei-Bonsu ° [address] [place] [address], [place] or [address], [place] (336) 841-8500 Phone number for both [place] and [place] locations is the same.  °[facility] [address], [place] (336) 299-0000   °[facility] [address] [place] [address],  or [address] (336) 852-7530 °(336) 878-2260   °  [facility] [address], [place] (336) 350-1642, phone; (336) 294-5005, fax Sees patients 1st and 3rd Saturday of every month.  Must not qualify for public or private insurance (i.e. Medicaid, Medicare, [place] Health Choice, Veterans' Benefits) • Household income should be no more than 200% of the poverty level •The clinic cannot treat you if you are pregnant or think you are pregnant • Sexually transmitted diseases are not treated at the clinic.  ° ° °Dental Care: °Organization         Address  Phone  Notes  °[facility] [address], [place] (336) 641-6152 Accepts children up to age 21 who are enrolled in Medicaid or [place] Health Choice; pregnant women with a Medicaid card; and children who have applied for Medicaid or [place] Health Choice, but were declined, whose parents can pay a reduced fee at time of service.  °[facility] [place]  [address], [place] (336) 641-7733 Accepts children up to age 21 who are  enrolled in Medicaid or [place] Health Choice; pregnant women with a Medicaid card; and children who have applied for Medicaid or [place] Health Choice, but were declined, whose parents can pay a reduced fee at time of service.  °[facility] ° [address], [place] (336) 641-4533 Patients are seen by appointment only. Walk-ins are not accepted. [facility] will see patients 18 years of age and older. °Monday - Tuesday (8am-5pm) °Most Wednesdays (8:30-5pm) °$30 per visit, cash only  °[facility] ° [address], [place] (336) 641-4533 Patients are seen by appointment only. Walk-ins are not accepted. [facility] will see patients 18 years of age and older. °One Wednesday Evening (Monthly: Volunteer Based).  $30 per visit, cash only  °[facility]  (919) 537-3737 for adults; Children under age 4, call Graduate Pediatric Dentistry at (919) [phone]. Children aged 4-14, please call (919) [phone] to request a pediatric application. ° Dental services are provided in all areas of dental care including fillings, crowns and bridges, complete and partial dentures, implants, gum treatment, root canals, and extractions. Preventive care is also provided. Treatment is provided to both adults and children. °Patients are selected via a lottery and there is often a waiting list. °  °[facility] [address], °[place] ° (336) 763-8833 [email or web address] °  °[facility] [address], [place], [place] (336)723-1848, Ext. 123 Second and Fourth Thursday of each month, opens at 6:30 AM; Clinic ends at 9 AM.  Patients are seen on a first-come first-served basis, and a limited number are seen during each clinic.  ° °[facility] ° [address], [place], [place] (336) 723-7904   Eligibility Requirements °You must have lived in [place], [place], or [place] counties for at least the last three months. °  You  cannot be eligible for state or federal sponsored healthcare insurance, including Veterans Administration, Medicaid, or Medicare. °  You generally cannot be eligible for healthcare insurance through your employer.  °  How to apply: °Eligibility screenings are held every Tuesday and Wednesday afternoon from 1:00 pm until 4:00 pm. You do not need an appointment for the interview!  °[facility] [address], [place], [place] 336-631-2330   °[facility]  336-342-8273   °[facility]  336-703-3100   °[facility]  336-570-6415   ° °Behavioral Health Resources in the Community: °Intensive Outpatient Programs °Organization         Address  Phone  Notes  °[facility] [address], [place], [place] 336-878-6098   °[facility] [address], [place], [place] 336-832-9800   °[facility] [address], [place], [place] ° 336-882-2125   °[facility] [address],  °[place], [place] 1-800-853-5163 or 336-641-4981   °Substance Abuse Resources °Organization         Address  Phone  Notes  °[facility]  336-882-2125   °[facility]  336-784-9470   °[facility]  336-285-9073   °[facility]  336-845-3988   °Residential & Outpatient Substance Abuse Program  1-800-659-3381   °Psychological Services °Organization         Address  Phone  Notes  °[facility]  336- 832-9600   °[facility]  336- 378-7881   °[facility] [address], [place] 1-800-853-5163 or 336-641-4981   ° °Mobile Crisis Teams °Organization         Address  Phone  Notes  °[facility], Mobile Crisis Care Unit  1-877-626-1772   °Assertive °[facility] ° [address] [place], [place] 336-834-9664   °Sharon DeEsch [address] °[place] 336-554-5454   ° °Self-Help/Support  Groups °Organization         Address  Phone             Notes  °[facility] - variety of support groups  336- 373-1402 Call for more information  °Narcotics Anonymous (NA), [facility] [address], °[place]  2 meetings at this location  ° °Residential Treatment Programs °Organization         Address  Phone  Notes  °[facility] [address],    °[place]  1-866-801-8205   °[facility] ° [address], [place], [place] 704-293-8524   °[facility] [address], [place] 336-845-3988 Admissions: 8am-3pm M-F  °[facility] [address]    °[place], [place] 336-841-1104   °[facility] [address], [place], [place] 336-379-7146   °[facility] [address]  °[place], [place] 336-285-9073   °[facility] - Intensive Outpatient [address], , [place] 336-852-3033   °[facility] [address]  °[place], [place] 1-877-615-2722 or 336-784-9470   °[facility] [address] [place], [place] 336-227-7417 Accepts Medicaid  °[facility] [address]  ° [place] 1-800-659-3381 Substance Abuse/Addiction Treatment  ° °[place] Behavioral Health Resources °Organization         Address  Phone  Notes  °[facility]  (888) 581-9988   °Julie Brannon, PhD [address] [place], [place]   (336) 349-5553 or (336) 951-0000   °[facility]   [address] °[place], [place] (336) 349-4454   °[facility] [address], [place], [place] (336) 342-8316 Insurance/Medicaid/sponsorship through [facility]  °[facility] [address]                                    [place], [place] (336) 342-8316 Therapy/tele-psych/case  °[facility]   [address]  ° [place], [place] (336) 349-2233    °Dr. Arfeen  (336) 349-4544   °[facility] [place]  [facility]  [employer] [facility] [place] [facility] 1) [address], [place] °2) [address], [place] °3)  [address], [place] (336) 349-3220 °(336) 342-7768 ° °(336) 342-8140   °[place] [facility] (336) 342-1394 or (336) 342-3537 (After Hours)    ° ° ° °

## 2014-06-22 NOTE — ED Provider Notes (Signed)
CSN: [id number]     Arrival date & time 06/22/14  1753 History  This chart was scribed for Lori Case, * by Erling Conte, ED Scribe. This patient was seen in room MHFT1/MHFT1 and the patient's care was started at 6:55 PM.     Chief Complaint  Patient presents with  . Dental Pain      The history is provided by the patient. No language interpreter was used.   HPI Comments: Lori Case is a 53 y.o. female who presents to the Emergency Department complaining of constant, moderate, "6/10", left sided dental pain for 2 days. Pt states that she has been trying to get in to see a dentist but they do not accept her insurance. Pt thinks she may have cracked a tooth on her left side. She has been taking Amoxicillin, Motrin and Ultram with no relief. She denies any facial swelling, fever, nausea, vomiting or diarrhea.    Past Medical History  Diagnosis Date  . Atrial fibrillation   . Hypertension   . Back pain   . GERD (gastroesophageal reflux disease)   . Thyroid disease   . Kidney stone   . Cirrhosis of liver Sep. 2014  . Carpal tunnel syndrome    Past Surgical History  Procedure Laterality Date  . Cesarean section    . Tonsillectomy    . Appendectomy    . Breast surgery    . Thyroidectomy    . Cesarean section    . Tubal ligation    . Cholecystectomy  Sep. 2014   No family history on file. History  Substance Use Topics  . Smoking status: Never Smoker   . Smokeless tobacco: Not on file  . Alcohol Use: No   OB History   Grav Para Term Preterm Abortions TAB SAB Ect Mult Living                 Review of Systems  Constitutional: Negative for fever and chills.  HENT: Positive for dental problem. Negative for facial swelling.   Gastrointestinal: Negative for nausea, vomiting and diarrhea.  All other systems reviewed and are negative.     Allergies  Motrin and Ultram  Home Medications   Prior to Admission medications   Medication Sig Start Date End  Date Taking? Authorizing Provider  Calcium Carbonate-Vitamin D (CALTRATE 600+D) 600-400 MG-UNIT per tablet Take 2 tablets by mouth 2 (two) times daily.    Historical Provider, MD  cyclobenzaprine (FLEXERIL) 10 MG tablet Take 10 mg by mouth 3 (three) times daily as needed. For muscle spasms     Historical Provider, MD  diazepam (VALIUM) 5 MG tablet Take 1 tablet (5 mg total) by mouth every 8 (eight) hours as needed for muscle spasms. 04/23/14   Evelina Bucy, MD  digoxin (LANOXIN) 0.25 MG tablet Take 250 mcg by mouth daily.      Historical Provider, MD  esomeprazole (NEXIUM) 40 MG capsule Take 40 mg by mouth daily before breakfast.      Historical Provider, MD  gabapentin (NEURONTIN) 300 MG capsule Take 300 mg by mouth 3 (three) times daily.    Historical Provider, MD  HYDROcodone-acetaminophen (NORCO/VICODIN) 5-325 MG per tablet Take 1 tablet by mouth every 6 (six) hours as needed. 10/29/13   Charles B. Karle Starch, MD  HYDROcodone-acetaminophen (NORCO/VICODIN) 5-325 MG per tablet Take 2 tablets by mouth every 4 (four) hours as needed. 05/27/14   Fransico Meadow, PA-C  HYDROcodone-acetaminophen (NORCO/VICODIN) 5-325 MG per tablet Take 1-2 tablets  by mouth every 6 hours as needed for pain. 06/10/14   [name], PA-C  HYDROcodone-acetaminophen (NORCO/VICODIN) 5-325 MG per tablet Take 2 tablets by mouth every 4 (four) hours as needed for moderate pain. 06/22/14   Lori Greek, MD  levothyroxine (SYNTHROID, LEVOTHROID) 200 MCG tablet Take 200 mcg by mouth daily.    Historical Provider, MD  lisinopril (PRINIVIL,ZESTRIL) 5 MG tablet Take 5 mg by mouth daily.    Historical Provider, MD  metoprolol (TOPROL-XL) 50 MG 24 hr tablet Take 50 mg by mouth 2 (two) times daily.      Historical Provider, MD  oxyCODONE-acetaminophen (PERCOCET) 5-325 MG per tablet Take 1 tablet by mouth every 6 (six) hours as needed for moderate pain. 04/23/14   Evelina Bucy, MD  pregabalin (LYRICA) 100 MG capsule Take 100 mg by mouth  2 (two) times daily.    Historical Provider, MD   Triage Vitals: BP 110/61  Pulse 83  Resp 18  Ht 4\' 11"  (1.499 m)  Wt 213 lb (96.616 kg)  BMI 43.00 kg/m2  SpO2 99%  Physical Exam  Constitutional: She is oriented to person, place, and time. She appears well-developed and well-nourished. No distress.  HENT:  Head: Normocephalic and atraumatic.  Right Ear: Hearing normal.  Left Ear: Hearing normal.  Nose: Nose normal.  Mouth/Throat: Oropharynx is clear and moist and mucous membranes are normal.  Tenderness to left upper central incisor without any swelling.   Eyes: Conjunctivae and EOM are normal. Pupils are equal, round, and reactive to light.  Neck: Normal range of motion. Neck supple.  Cardiovascular: Regular rhythm, S1 normal and S2 normal.  Exam reveals no gallop and no friction rub.   No murmur heard. Pulmonary/Chest: Effort normal and breath sounds normal. No respiratory distress. She exhibits no tenderness.  Abdominal: Soft. Normal appearance and bowel sounds are normal. There is no hepatosplenomegaly. There is no tenderness. There is no rebound, no guarding, no tenderness at McBurney's point and negative Murphy's sign. No hernia.  Musculoskeletal: Normal range of motion.  Neurological: She is alert and oriented to person, place, and time. She has normal strength. No cranial nerve deficit or sensory deficit. Coordination normal. GCS eye subscore is 4. GCS verbal subscore is 5. GCS motor subscore is 6.  Skin: Skin is warm, dry and intact. No rash noted. No cyanosis.  Psychiatric: She has a normal mood and affect. Her speech is normal and behavior is normal. Thought content normal.    ED Course  Procedures (including critical care time)   DIAGNOSTIC STUDIES: Oxygen Saturation is 99% on RA, normal by my interpretation.    COORDINATION OF CARE: 7:04 PM- Will discharge pt with Vicodin and give her resources for a dentist that she can see in the area to have the tooth  pulled.    Labs Review Labs Reviewed - No data to display  Imaging Review No results found.   EKG Interpretation None      MDM   Final diagnoses:  Toothache   Patient presents to the ER for evaluation of patient has significant decay of her upper central incisor without evidence of abscess. She reports that she is already on amoxicillin.   I personally performed the services described in this documentation, which was scribed in my presence. The recorded information has been reviewed and is accurate.      Lori Greek, MD 06/23/14 Laureen Abrahams

## 2014-07-12 ENCOUNTER — Encounter (HOSPITAL_BASED_OUTPATIENT_CLINIC_OR_DEPARTMENT_OTHER): Payer: Self-pay | Admitting: Emergency Medicine

## 2014-07-12 ENCOUNTER — Emergency Department (HOSPITAL_BASED_OUTPATIENT_CLINIC_OR_DEPARTMENT_OTHER)
Admission: EM | Admit: 2014-07-12 | Discharge: 2014-07-12 | Disposition: A | Discharge status: Home / Self Care | Payer: Medicaid Other | Attending: Emergency Medicine | Admitting: Emergency Medicine

## 2014-07-12 ENCOUNTER — Emergency Department (HOSPITAL_BASED_OUTPATIENT_CLINIC_OR_DEPARTMENT_OTHER): Payer: Medicaid Other

## 2014-07-12 DIAGNOSIS — Y929 Unspecified place or not applicable: Secondary | ICD-10-CM | POA: Diagnosis not present

## 2014-07-12 DIAGNOSIS — Z79899 Other long term (current) drug therapy: Secondary | ICD-10-CM | POA: Diagnosis not present

## 2014-07-12 DIAGNOSIS — W1839XA Other fall on same level, initial encounter: Secondary | ICD-10-CM | POA: Diagnosis not present

## 2014-07-12 DIAGNOSIS — E079 Disorder of thyroid, unspecified: Secondary | ICD-10-CM | POA: Diagnosis not present

## 2014-07-12 DIAGNOSIS — I10 Essential (primary) hypertension: Secondary | ICD-10-CM | POA: Insufficient documentation

## 2014-07-12 DIAGNOSIS — Z87442 Personal history of urinary calculi: Secondary | ICD-10-CM | POA: Diagnosis not present

## 2014-07-12 DIAGNOSIS — W19XXXA Unspecified fall, initial encounter: Secondary | ICD-10-CM

## 2014-07-12 DIAGNOSIS — Y939 Activity, unspecified: Secondary | ICD-10-CM | POA: Insufficient documentation

## 2014-07-12 DIAGNOSIS — I4891 Unspecified atrial fibrillation: Secondary | ICD-10-CM | POA: Insufficient documentation

## 2014-07-12 DIAGNOSIS — K219 Gastro-esophageal reflux disease without esophagitis: Secondary | ICD-10-CM | POA: Insufficient documentation

## 2014-07-12 DIAGNOSIS — S99921A Unspecified injury of right foot, initial encounter: Secondary | ICD-10-CM

## 2014-07-12 NOTE — ED Provider Notes (Signed)
CSN: [id number]     Arrival date & time 07/12/14  1535 History   First MD Initiated Contact with Patient 07/12/14 1540     Chief Complaint  Patient presents with  . Foot Injury     (Consider location/radiation/quality/duration/timing/severity/associated sxs/prior Treatment) Patient is a 53 y.o. female presenting with foot injury.  Foot Injury Location:  Foot Time since incident:  19 hours Injury: yes   Mechanism of injury: fall   Fall:    Fall occurred: slipped on wet gravel. Pain details:    Quality:  Sharp   Severity:  Moderate   Onset quality:  Sudden   Duration:  19 hours   Timing:  Constant   Progression:  Unchanged Chronicity:  New Relieved by:  Nothing Worsened by:  Bearing weight Ineffective treatments:  Ice (aleve) Associated symptoms: no numbness and no swelling     Past Medical History  Diagnosis Date  . Atrial fibrillation   . Hypertension   . Back pain   . GERD (gastroesophageal reflux disease)   . Thyroid disease   . Kidney stone   . Cirrhosis of liver Sep. 2014  . Carpal tunnel syndrome    Past Surgical History  Procedure Laterality Date  . Cesarean section    . Tonsillectomy    . Appendectomy    . Breast surgery    . Thyroidectomy    . Cesarean section    . Tubal ligation    . Cholecystectomy  Sep. 2014   History reviewed. No pertinent family history. History  Substance Use Topics  . Smoking status: Never Smoker   . Smokeless tobacco: Not on file  . Alcohol Use: No   OB History   Grav Para Term Preterm Abortions TAB SAB Ect Mult Living                 Review of Systems  All other systems reviewed and are negative.     Allergies  Motrin and Ultram  Home Medications   Prior to Admission medications   Medication Sig Start Date End Date Taking? Authorizing Provider  Calcium Carbonate-Vitamin D (CALTRATE 600+D) 600-400 MG-UNIT per tablet Take 2 tablets by mouth 2 (two) times daily.    Historical Provider, MD  cyclobenzaprine  (FLEXERIL) 10 MG tablet Take 10 mg by mouth 3 (three) times daily as needed. For muscle spasms     Historical Provider, MD  diazepam (VALIUM) 5 MG tablet Take 1 tablet (5 mg total) by mouth every 8 (eight) hours as needed for muscle spasms. 04/23/14   Evelina Bucy, MD  digoxin (LANOXIN) 0.25 MG tablet Take 250 mcg by mouth daily.      Historical Provider, MD  esomeprazole (NEXIUM) 40 MG capsule Take 40 mg by mouth daily before breakfast.      Historical Provider, MD  gabapentin (NEURONTIN) 300 MG capsule Take 300 mg by mouth 3 (three) times daily.    Historical Provider, MD  HYDROcodone-acetaminophen (NORCO/VICODIN) 5-325 MG per tablet Take 1 tablet by mouth every 6 (six) hours as needed. 10/29/13   Charles B. Karle Starch, MD  HYDROcodone-acetaminophen (NORCO/VICODIN) 5-325 MG per tablet Take 2 tablets by mouth every 4 (four) hours as needed. 05/27/14   Fransico Meadow, PA-C  HYDROcodone-acetaminophen (NORCO/VICODIN) 5-325 MG per tablet Take 1-2 tablets by mouth every 6 hours as needed for pain. 06/10/14   [name], PA-C  HYDROcodone-acetaminophen (NORCO/VICODIN) 5-325 MG per tablet Take 2 tablets by mouth every 4 (four) hours as needed for moderate pain.  06/22/14   Orpah Greek, MD  levothyroxine (SYNTHROID, LEVOTHROID) 200 MCG tablet Take 200 mcg by mouth daily.    Historical Provider, MD  lisinopril (PRINIVIL,ZESTRIL) 5 MG tablet Take 5 mg by mouth daily.    Historical Provider, MD  metoprolol (TOPROL-XL) 50 MG 24 hr tablet Take 50 mg by mouth 2 (two) times daily.      Historical Provider, MD  oxyCODONE-acetaminophen (PERCOCET) 5-325 MG per tablet Take 1 tablet by mouth every 6 (six) hours as needed for moderate pain. 04/23/14   Evelina Bucy, MD  pregabalin (LYRICA) 100 MG capsule Take 100 mg by mouth 2 (two) times daily.    Historical Provider, MD   BP 136/56  Pulse 60  Temp(Src) 97.7 F (36.5 C) (Oral)  Resp 20  Ht 4\' 11"  (1.499 m)  Wt 214 lb (97.07 kg)  BMI 43.20 kg/m2  SpO2  99% Physical Exam  Nursing note and vitals reviewed. Constitutional: She is oriented to person, place, and time. She appears well-developed and well-nourished. No distress.  HENT:  Head: Normocephalic and atraumatic.  Eyes: Conjunctivae are normal. No scleral icterus.  Neck: Neck supple.  Cardiovascular: Normal rate and intact distal pulses.   Pulmonary/Chest: Effort normal. No stridor. No respiratory distress.  Abdominal: Normal appearance. She exhibits no distension.  Musculoskeletal:       Feet:  Neurological: She is alert and oriented to person, place, and time.  Skin: Skin is warm and dry. No rash noted.  Psychiatric: She has a normal mood and affect. Her behavior is normal.    ED Course  Procedures (including critical care time) Labs Review Labs Reviewed - No data to display  Imaging Review Dg Foot Complete Right  07/12/2014   CLINICAL DATA:  Foot pain secondary to a fall today. Redness and swelling.  EXAM: RIGHT FOOT COMPLETE - 3+ VIEW  COMPARISON:  None.  FINDINGS: No fracture or dislocation. Prominent plantar calcaneal spur. Slight bunion formation on the head of the first metatarsal. Benign bone island in the distal phalanx of the second toe. Os naviculare, a normal variant.  IMPRESSION: No significant abnormality.   Electronically Signed   By: Rozetta Nunnery M.D.   On: 07/12/2014 16:29  All radiology studies independently viewed by me.      EKG Interpretation None      MDM   Final diagnoses:  Fall  Right foot injury, initial encounter    53 yo female with right foot/ankle injury.  No signs of external trauma.  She denies any other injuries including head or neck injuries.    On review of the [place] Controlled Substance database, she has received multiple prescriptions from multiple providers and filled them at multiple pharmacies.  I am suspicious of drug seeking behavior, but will obtain plain films to rule out significant injuries of her foot.    4:56 PM plain  film negative.  Dc home.   Artis Delay, MD 07/12/14 [phone]

## 2014-07-12 NOTE — ED Notes (Signed)
Pt c/o right foot injury x 1 day

## 2014-07-20 ENCOUNTER — Encounter (HOSPITAL_BASED_OUTPATIENT_CLINIC_OR_DEPARTMENT_OTHER): Payer: Self-pay | Admitting: Emergency Medicine

## 2014-07-20 ENCOUNTER — Emergency Department (HOSPITAL_BASED_OUTPATIENT_CLINIC_OR_DEPARTMENT_OTHER)
Admission: EM | Admit: 2014-07-20 | Discharge: 2014-07-20 | Disposition: A | Discharge status: Home / Self Care | Payer: Medicaid Other | Attending: Emergency Medicine | Admitting: Emergency Medicine

## 2014-07-20 DIAGNOSIS — I10 Essential (primary) hypertension: Secondary | ICD-10-CM | POA: Diagnosis not present

## 2014-07-20 DIAGNOSIS — K219 Gastro-esophageal reflux disease without esophagitis: Secondary | ICD-10-CM | POA: Diagnosis not present

## 2014-07-20 DIAGNOSIS — Z79899 Other long term (current) drug therapy: Secondary | ICD-10-CM | POA: Diagnosis not present

## 2014-07-20 DIAGNOSIS — K088 Other specified disorders of teeth and supporting structures: Secondary | ICD-10-CM | POA: Diagnosis present

## 2014-07-20 DIAGNOSIS — Z8669 Personal history of other diseases of the nervous system and sense organs: Secondary | ICD-10-CM | POA: Diagnosis not present

## 2014-07-20 DIAGNOSIS — Z87442 Personal history of urinary calculi: Secondary | ICD-10-CM | POA: Diagnosis not present

## 2014-07-20 DIAGNOSIS — E079 Disorder of thyroid, unspecified: Secondary | ICD-10-CM | POA: Diagnosis not present

## 2014-07-20 DIAGNOSIS — K0889 Other specified disorders of teeth and supporting structures: Secondary | ICD-10-CM

## 2014-07-20 HISTORY — DX: Complete loss of teeth due to caries, unspecified class: K08.139

## 2014-07-20 MED ORDER — HYDROCODONE-ACETAMINOPHEN 5-325 MG PO TABS: 1.0000 | ORAL_TABLET | Freq: Four times a day (QID) | ORAL | Status: DC | PRN

## 2014-07-20 MED ORDER — PENICILLIN V POTASSIUM 500 MG PO TABS: 500.0000 mg | ORAL_TABLET | Freq: Four times a day (QID) | ORAL | Status: AC

## 2014-07-20 NOTE — ED Notes (Signed)
Upper teeth pain since yesterday.  Needs dentist referral.  Pt states it is sensitive to air, cold and heat.  Pt has broken tooth with cavities.

## 2014-07-20 NOTE — Discharge Instructions (Signed)
Dental Pain °A tooth ache may be caused by cavities (tooth decay). Cavities expose the nerve of the tooth to air and hot or cold temperatures. It may come from an infection or abscess (also called a boil or furuncle) around your tooth. It is also often caused by dental caries (tooth decay). This causes the pain you are having. °DIAGNOSIS  °Your caregiver can diagnose this problem by exam. °TREATMENT  °· If caused by an infection, it may be treated with medications which kill germs (antibiotics) and pain medications as prescribed by your caregiver. Take medications as directed. °· Only take over-the-counter or prescription medicines for pain, discomfort, or fever as directed by your caregiver. °· Whether the tooth ache today is caused by infection or dental disease, you should see your dentist as soon as possible for further care. °SEEK MEDICAL CARE IF: °The exam and treatment you received today has been provided on an emergency basis only. This is not a substitute for complete medical or dental care. If your problem worsens or new problems (symptoms) appear, and you are unable to meet with your dentist, call or return to this location. °SEEK IMMEDIATE MEDICAL CARE IF:  °· You have a fever. °· You develop redness and swelling of your face, jaw, or neck. °· You are unable to open your mouth. °· You have severe pain uncontrolled by pain medicine. °MAKE SURE YOU:  °· Understand these instructions. °· Will watch your condition. °· Will get help right away if you are not doing well or get worse. °Document Released: 09/01/2005 Document Revised: 11/24/2011 Document Reviewed: 04/19/2008 °ExitCare® Patient Information ©2015 [employer]. This information is not intended to replace advice given to you by your health care provider. Make sure you discuss any questions you have with your health care provider. ° °Emergency Department Resource Guide °1) Find a Doctor and Pay Out of Pocket °Although you won't have to find out who  is covered by your insurance plan, it is a good idea to ask around and get recommendations. You will then need to call the office and see if the doctor you have chosen will accept you as a new patient and what types of options they offer for patients who are self-pay. Some doctors offer discounts or will set up payment plans for their patients who do not have insurance, but you will need to ask so you aren't surprised when you get to your appointment. ° °2) Contact Your Local Health Department °Not all health departments have doctors that can see patients for sick visits, but many do, so it is worth a call to see if yours does. If you don't know where your local health department is, you can check in your phone book. The CDC also has a tool to help you locate your state's health department, and many state websites also have listings of all of their local health departments. ° °3) Find a Walk-in Clinic °If your illness is not likely to be very severe or complicated, you may want to try a walk in clinic. These are popping up all over the country in pharmacies, drugstores, and shopping centers. They're usually staffed by nurse practitioners or physician assistants that have been trained to treat common illnesses and complaints. They're usually fairly quick and inexpensive. However, if you have serious medical issues or chronic medical problems, these are probably not your best option. ° °No Primary Care Doctor: °- Call [facility] at  832-8000 - they can help you locate a primary   care doctor that  accepts your insurance, provides certain services, etc. °- Physician Referral Service- 1-800-533-3463 ° °Chronic Pain Problems: °Organization         Address  Phone   Notes  °[facility]  (336) 297-2271 Patients need to be referred by their primary care doctor.  ° °Medication Assistance: °Organization         Address  Phone   Notes  °[place] [facility] [address]  [address] °[place], [place] [address] (336) 641-8030 --Must be a resident of [place] °-- Must have NO insurance coverage whatsoever (no Medicaid/ Medicare, etc.) °-- The pt. MUST have a primary care doctor that directs their care regularly and follows them in the community °  °[facility]  (866) 331-1348   °[employer]  (888) 892-1162   ° °Agencies that provide inexpensive medical care: °Organization         Address  Phone   Notes  °[facility]  (336) 832-8035   °[facility]    (336) 832-7272   °[facility] [address] °[place], [place] [address] (336) 832-4777   °[facility] [address], °[place] (336) 271-4999   °[facility]    (336) 373-0678   °[facility]    (336) 272-1050   °[facility] ° [address], [place] Phone:  (336) 832-4444, Fax:  (336) 832-4440 Hours of Operation:  9 am - 6 pm, M-F.  Also accepts Medicaid/Medicare and self-pay.  °[facility] ° [address], [place] Phone: (336) 832-3150, Fax: (336) [phone]. Hours of Operation:  8:30 am - 5:30 pm, M-F.  Also accepts Medicaid and self-pay.  °[facility] [place] [address], [place] Phone: (336) 878-6027   °[facility] [address], [place], [place] (336)723-1848, Ext. 123 Mondays & Thursdays: 7-9 AM.  First 15 patients are seen on a first come, first serve basis. °  ° °Medicaid-accepting [place] Providers: ° °Organization         Address  Phone   Notes  °[facility] [address], [place] (336) 641-2100 Also accepts self-pay patients.  °[facility] [address], [place] ° (336) 856-9996   °[facility] [address], [place] (336) 288-8857   °[facility] [address] [place] [address], [place] (336) 299-7000   °Veita Bland [address], [place]  ° (336) 373-1557 Only accepts [place] Access Medicaid patients after they have their name applied to their card.  ° °Self-Pay (no insurance) in [place]: ° °Organization         Address  Phone   Notes  °Sickle Cell Patients, [facility] [address], [place] (336) 832-1970   °[facility] [address], [place] (336) 832-4400   °[facility] ° [address], [place] (336) 992-4800   °[facility]/Dr. Osei-Bonsu ° [address] [place] [address], [place] or [address], [place] (336) [phone] Phone number for both [place] and [place] locations is the same.  °[facility] [address], [place] (336) 299-0000   °[facility] [address] [place] [address], [place] or [address] (336) 852-7530 °(336) 878-2260   °  [facility] [address], [place] (336) 350-1642, phone; (336) 294-5005, fax Sees patients 1st and 3rd Saturday of every month.  Must not qualify for public or private insurance (i.e. Medicaid, Medicare, [place] Health Choice, Veterans' Benefits) • Household income should be no more than 200% of the poverty level •The clinic cannot treat you if you are pregnant or think you are pregnant • Sexually transmitted diseases are not treated at the clinic.  ° ° °Dental Care: °Organization         Address  Phone  Notes  °[facility] [facility] [address], [place] (336) 641-6152 Accepts children up to age 21 who are enrolled in Medicaid or [place] Health Choice; pregnant women with a Medicaid card; and children who have applied for Medicaid or [place] Health Choice, but were declined, whose parents can pay a reduced fee at time of service.  °[facility] [place]  [address], [place] (336) 641-7733 Accepts children up to age 21 who are  enrolled in Medicaid or [place] Health Choice; pregnant women with a Medicaid card; and children who have applied for Medicaid or [place] Health Choice, but were declined, whose parents can pay a reduced fee at time of service.  °[facility] ° [address], [place] (336) 641-4533 Patients are seen by appointment only. Walk-ins are not accepted. [facility] will see patients 18 years of age and older. °Monday - Tuesday (8am-5pm) °Most Wednesdays (8:30-5pm) °$30 per visit, cash only  °[facility] ° [address], [place] (336) 641-4533 Patients are seen by appointment only. Walk-ins are not accepted. [facility] will see patients 18 years of age and older. °One Wednesday Evening (Monthly: Volunteer Based).  $30 per visit, cash only  °[facility]  (919) 537-3737 for adults; Children under age 4, call Graduate Pediatric Dentistry at ([phone]) [phone]. Children aged 4-14, please call (919) [phone] to request a pediatric application. ° Dental services are provided in all areas of dental care including fillings, crowns and bridges, complete and partial dentures, implants, gum treatment, root canals, and extractions. Preventive care is also provided. Treatment is provided to both adults and children. °Patients are selected via a lottery and there is often a waiting list. °  °[facility] [address], °[place] ° (336) 763-8833 [email or web address] °  °[facility] [address], [place], [place] (336)723-1848, Ext. 123 Second and Fourth Thursday of each month, opens at 6:30 AM; Clinic ends at 9 AM.  Patients are seen on a first-come first-served basis, and a limited number are seen during each clinic.  ° °[facility] ° [address], [place], [place] (336) 723-7904   Eligibility Requirements °You must have lived in [place], [place], or [place] counties for at least the last three months. °  You  cannot be eligible for state or federal sponsored healthcare insurance, including Veterans Administration, Medicaid, or Medicare. °  You generally cannot be eligible for healthcare insurance through your employer.  °  How to apply: °Eligibility screenings are held every Tuesday and Wednesday afternoon from 1:00 pm until 4:00 pm. You do not need an appointment for the interview!  °[facility] [address], [place], [place] 336-631-2330   °[facility]  336-342-8273   °[facility]  336-703-3100   °[facility]  336-570-6415   ° °Behavioral Health Resources in the Community: °Intensive Outpatient Programs °Organization         Address  Phone  Notes  °[facility] [address], [place], [place] 336-878-6098   °[facility] [address], [place], [place] 336-832-9800   °[facility] [address], [place], [place] ° 336-882-2125   °[facility] [address],  °[place], [place] 1-800-853-5163 or 336-641-4981   °Substance Abuse Resources °Organization         Address  Phone  Notes  °[facility]  336-882-2125   °[facility]  336-784-9470   °[facility]  336-285-9073   °[facility]  336-845-3988   °Residential & Outpatient Substance Abuse Program  1-800-659-3381   °Psychological Services °Organization         Address  Phone  Notes  ° Health  336- 832-9600   °[facility]  336- 378-7881   °[facility] [address], [place] 1-800-853-5163 or 336-641-4981   ° °Mobile Crisis Teams °Organization         Address  Phone  Notes  °[facility], Mobile Crisis Care Unit  1-877-626-1772   °Assertive °[facility] ° [address] [place], [place] 336-834-9664   °Sharon DeEsch [address] °[place] 336-554-5454   ° °Self-Help/Support  Groups °Organization         Address  Phone             Notes  °[facility] [place] - variety of support groups  336- 373-1402 Call for more information  °Narcotics Anonymous (NA), [facility] [address], °[place]  2 meetings at this location  ° °Residential Treatment Programs °Organization         Address  Phone  Notes  °[facility] [address],    °[place]  1-866-801-8205   °[facility] ° [address], [place], [place] 704-293-8524   °[facility] [address], [place] 336-845-3988 Admissions: 8am-3pm M-F  °[facility] [address]    °[place], [place] 336-841-1104   °[facility] [address], [place] 336-379-7146   °[facility] [address]  °[place], [place] 336-285-9073   °[facility] - Intensive Outpatient [address], [place], [place] 336-852-3033   °[facility] [address]  °[place], [place] 1-877-615-2722 or 336-784-9470   °[facility] [address] , [place] 336-227-7417 Accepts Medicaid  °[facility] [address]  °[place] 1-800-659-3381 Substance Abuse/Addiction Treatment  ° °[place] Behavioral Health Resources °Organization         Address  Phone  Notes  °[facility]  (888) 581-9988   °Julie Brannon, PhD [address] [place], [place]   (336) 349-5553 or (336) 951-0000   °[facility]   [address] °[place], [place] (336) 349-4454   °[facility] [address], [place], [place] (336) 342-8316 Insurance/Medicaid/sponsorship through [facility]  °[facility] [address]                                    [place], [place] (336) 342-8316 Therapy/tele-psych/case  °[facility]   [address]  ° [place], [place] (336) 349-2233    °Dr. Arfeen  (336) 349-4544   °[facility] [place]  [facility]  [employer] [place] [facility] 1) [address], [place] °2) [address], [place] °3)  [address], [place] (336) 349-3220 °(336) 342-7768 ° °(336) 342-8140   °[place] [facility] (336) 342-1394 or (336) 342-3537 (After Hours)    ° ° ° °

## 2014-07-20 NOTE — ED Notes (Signed)
Directed to pharmacy to pick up medications 

## 2014-07-20 NOTE — ED Provider Notes (Signed)
CSN: [id number]     Arrival date & time 07/20/14  4492 History   First MD Initiated Contact with Patient 07/20/14 1009     Chief Complaint  Patient presents with  . Dental Pain     (Consider location/radiation/quality/duration/timing/severity/associated sxs/prior Treatment) Patient is a 53 y.o. female presenting with tooth pain. The history is provided by the patient.  Dental Pain Location:  Upper Upper teeth location:  9/LU central incisor and 8/RU central incisor Quality:  Dull and aching Severity:  Moderate Onset quality:  Gradual Duration:  1 day Timing:  Constant Progression:  Worsening Chronicity:  New Context: dental fracture   Relieved by:  Nothing Worsened by:  Nothing tried Ineffective treatments:  None tried Associated symptoms: no congestion, no drooling, no fever, no headaches and no neck pain     Past Medical History  Diagnosis Date  . Atrial fibrillation   . Hypertension   . Back pain   . GERD (gastroesophageal reflux disease)   . Thyroid disease   . Kidney stone   . Cirrhosis of liver Sep. 2014  . Carpal tunnel syndrome   . Teeth missing due to caries    Past Surgical History  Procedure Laterality Date  . Cesarean section    . Tonsillectomy    . Appendectomy    . Breast surgery    . Thyroidectomy    . Cesarean section    . Tubal ligation    . Cholecystectomy  Sep. 2014   No family history on file. History  Substance Use Topics  . Smoking status: Never Smoker   . Smokeless tobacco: Not on file  . Alcohol Use: No   OB History    No data available     Review of Systems  Constitutional: Negative for fever and fatigue.  HENT: Negative for congestion and drooling.   Eyes: Negative for pain.  Respiratory: Negative for cough and shortness of breath.   Cardiovascular: Negative for chest pain.  Gastrointestinal: Negative for nausea, vomiting, abdominal pain and diarrhea.  Genitourinary: Negative for dysuria and hematuria.  Musculoskeletal:  Negative for back pain, gait problem and neck pain.  Skin: Negative for color change.  Neurological: Negative for dizziness and headaches.  Hematological: Negative for adenopathy.  Psychiatric/Behavioral: Negative for behavioral problems.  All other systems reviewed and are negative.     Allergies  Motrin and Ultram  Home Medications   Prior to Admission medications   Medication Sig Start Date End Date Taking? Authorizing Provider  cyclobenzaprine (FLEXERIL) 10 MG tablet Take 10 mg by mouth 3 (three) times daily as needed. For muscle spasms    Yes Historical Provider, MD  digoxin (LANOXIN) 0.25 MG tablet Take 250 mcg by mouth daily.     Yes Historical Provider, MD  esomeprazole (NEXIUM) 40 MG capsule Take 40 mg by mouth daily before breakfast.     Yes Historical Provider, MD  levothyroxine (SYNTHROID, LEVOTHROID) 200 MCG tablet Take 200 mcg by mouth daily.   Yes Historical Provider, MD  lisinopril (PRINIVIL,ZESTRIL) 5 MG tablet Take 5 mg by mouth daily.   Yes Historical Provider, MD  metoprolol (TOPROL-XL) 50 MG 24 hr tablet Take 50 mg by mouth 2 (two) times daily.     Yes Historical Provider, MD  pregabalin (LYRICA) 100 MG capsule Take 100 mg by mouth 2 (two) times daily.   Yes Historical Provider, MD  diazepam (VALIUM) 5 MG tablet Take 1 tablet (5 mg total) by mouth every 8 (eight) hours as needed  for muscle spasms. 04/23/14   Evelina Bucy, MD  HYDROcodone-acetaminophen (NORCO/VICODIN) 5-325 MG per tablet Take 1 tablet by mouth every 6 (six) hours as needed. 10/29/13   Charles B. Karle Starch, MD  HYDROcodone-acetaminophen (NORCO/VICODIN) 5-325 MG per tablet Take 2 tablets by mouth every 4 (four) hours as needed. 05/27/14   Fransico Meadow, PA-C  HYDROcodone-acetaminophen (NORCO/VICODIN) 5-325 MG per tablet Take 1-2 tablets by mouth every 6 hours as needed for pain. 06/10/14   [name], PA-C  HYDROcodone-acetaminophen (NORCO/VICODIN) 5-325 MG per tablet Take 2 tablets by mouth every 4  (four) hours as needed for moderate pain. 06/22/14   Orpah Greek, MD  oxyCODONE-acetaminophen (PERCOCET) 5-325 MG per tablet Take 1 tablet by mouth every 6 (six) hours as needed for moderate pain. 04/23/14   Evelina Bucy, MD   BP 121/64 mmHg  Pulse 70  Temp(Src) 97.8 F (36.6 C)  Resp 18  Ht 4\' 11"  (1.499 m)  Wt 214 lb (97.07 kg)  BMI 43.20 kg/m2  SpO2 99% Physical Exam  Constitutional: She is oriented to person, place, and time. She appears well-developed and well-nourished.  HENT:  Head: Normocephalic.  Mouth/Throat: No oropharyngeal exudate.  Chronic appearing Lissa Merlin type II and possibly Lissa Merlin type III fractures to the upper 2 central incisors. Mild to moderate tenderness to palpation of these teeth.  No oral abscess is noted.  Normal appearing posterior oropharynx.  Eyes: Conjunctivae and EOM are normal. Pupils are equal, round, and reactive to light.  Neck: Normal range of motion. Neck supple.  Cardiovascular: Normal rate, regular rhythm, normal heart sounds and intact distal pulses.  Exam reveals no gallop and no friction rub.   No murmur heard. Pulmonary/Chest: Effort normal and breath sounds normal. No respiratory distress. She has no wheezes.  Abdominal: Soft. Bowel sounds are normal. There is no tenderness. There is no rebound and no guarding.  Musculoskeletal: Normal range of motion. She exhibits no edema or tenderness.  Neurological: She is alert and oriented to person, place, and time.  Skin: Skin is warm and dry.  Psychiatric: She has a normal mood and affect. Her behavior is normal.  Nursing note and vitals reviewed.   ED Course  Procedures (including critical care time) Labs Review Labs Reviewed - No data to display  Imaging Review No results found.   EKG Interpretation None      MDM   Final diagnoses:  Pain, dental    10:37 AM 53 y.o. female who presents with dental pain since yesterday. She states that she has had a chronic and worsening  fracture of her upper 2 central incisors for about one year. She denies any fevers or other associated symptoms. Vital signs unremarkable here. Will provide prescription for pain control, antibiotics, and recommend close follow-up with dentistry.    Pamella Pert, MD 07/20/14 [phone]

## 2014-08-18 ENCOUNTER — Emergency Department (HOSPITAL_BASED_OUTPATIENT_CLINIC_OR_DEPARTMENT_OTHER)
Admission: EM | Admit: 2014-08-18 | Discharge: 2014-08-18 | Disposition: A | Discharge status: Home / Self Care | Payer: Medicaid Other | Attending: Emergency Medicine | Admitting: Emergency Medicine

## 2014-08-18 ENCOUNTER — Encounter (HOSPITAL_BASED_OUTPATIENT_CLINIC_OR_DEPARTMENT_OTHER): Payer: Self-pay

## 2014-08-18 DIAGNOSIS — E079 Disorder of thyroid, unspecified: Secondary | ICD-10-CM | POA: Insufficient documentation

## 2014-08-18 DIAGNOSIS — Z87442 Personal history of urinary calculi: Secondary | ICD-10-CM | POA: Diagnosis not present

## 2014-08-18 DIAGNOSIS — M549 Dorsalgia, unspecified: Secondary | ICD-10-CM

## 2014-08-18 DIAGNOSIS — M545 Low back pain: Secondary | ICD-10-CM | POA: Diagnosis not present

## 2014-08-18 DIAGNOSIS — G8929 Other chronic pain: Secondary | ICD-10-CM | POA: Insufficient documentation

## 2014-08-18 DIAGNOSIS — E119 Type 2 diabetes mellitus without complications: Secondary | ICD-10-CM | POA: Insufficient documentation

## 2014-08-18 DIAGNOSIS — K219 Gastro-esophageal reflux disease without esophagitis: Secondary | ICD-10-CM | POA: Insufficient documentation

## 2014-08-18 DIAGNOSIS — I10 Essential (primary) hypertension: Secondary | ICD-10-CM | POA: Insufficient documentation

## 2014-08-18 DIAGNOSIS — Z79899 Other long term (current) drug therapy: Secondary | ICD-10-CM | POA: Insufficient documentation

## 2014-08-18 HISTORY — DX: Sciatica, unspecified side: M54.30

## 2014-08-18 HISTORY — DX: Type 2 diabetes mellitus without complications: E11.9

## 2014-08-18 LAB — URINALYSIS, ROUTINE W REFLEX MICROSCOPIC
Glucose, UA: NEGATIVE mg/dL
Ketones, ur: NEGATIVE mg/dL
Leukocytes, UA: NEGATIVE
NITRITE: NEGATIVE
PH: 6 (ref 5.0–8.0)
Protein, ur: NEGATIVE mg/dL
SPECIFIC GRAVITY, URINE: 1.018 (ref 1.005–1.030)
Urobilinogen, UA: >8 mg/dL — ABNORMAL HIGH (ref 0.0–1.0)

## 2014-08-18 LAB — URINE MICROSCOPIC-ADD ON

## 2014-08-18 MED ORDER — CYCLOBENZAPRINE HCL 10 MG PO TABS
5.0000 mg | ORAL_TABLET | Freq: Once | ORAL | Status: AC
Start: 2014-08-18 — End: 2014-08-18
  Administered 2014-08-18: 5 mg via ORAL

## 2014-08-18 NOTE — Discharge Instructions (Signed)
Please read and follow all provided instructions.  Your diagnoses today include:  1. Chronic back pain    Tests performed today include:  Vital signs - see below for your results today  Medications prescribed:   None  Take any prescribed medications only as directed.  Home care instructions:   Follow any educational materials contained in this packet  Please rest, use ice or heat on your back for the next several days  Do not lift, push, pull anything more than 10 pounds for the next week  Follow-up instructions: Please follow-up with your primary care provider or orthopedist in the next 1 week for further evaluation of your symptoms.   Return instructions:  SEEK IMMEDIATE MEDICAL ATTENTION IF YOU HAVE:  New numbness, tingling, weakness, or problem with the use of your arms or legs  Severe back pain not relieved with medications  Loss control of your bowels or bladder  Increasing pain in any areas of the body (such as chest or abdominal pain)  Shortness of breath, dizziness, or fainting.   Worsening nausea (feeling sick to your stomach), vomiting, fever, or sweats  Any other emergent concerns regarding your health   Additional Information:  Your vital signs today were: BP 126/54 mmHg   Pulse 63   Temp(Src) 98.6 F (37 C) (Oral)   Resp 16   Ht 4\' 11"  (1.499 m)   Wt 212 lb (96.163 kg)   BMI 42.80 kg/m2   SpO2 99% If your blood pressure (BP) was elevated above 135/85 this visit, please have this repeated by your doctor within one month. --------------

## 2014-08-18 NOTE — ED Provider Notes (Signed)
CSN: [id number]     Arrival date & time 08/18/14  1442 History   First MD Initiated Contact with Patient 08/18/14 1625     Chief Complaint  Patient presents with  . Back Pain     (Consider location/radiation/quality/duration/timing/severity/associated sxs/prior Treatment) HPI Comments: Patients with history of chronic pain, chronic back pain, on chronic pain medication -- presents with worsening pain in her lower back beginning yesterday morning. Pain radiates down into her left buttock and left leg. She she is able to walk but it is uncomfortable for her. She has had some tingling and decreased sensation in her left leg. Patient states at first that she has only been taking Aleve for her pain, however admits to taking her chronic pain medication without any relief. She is also on Flexeril at home. Patient denies warning symptoms of back pain including: fecal incontinence, urinary retention or overflow incontinence, night sweats, waking from sleep with back pain, unexplained fevers or weight loss, h/o cancer, IVDU, recent trauma. No dysuria or vaginal symptoms.  Per [facility] substance reporting database patient has received 20 prescriptions for controlled medications over the past 6 months including monthly prescriptions of Zohydro ER 10 mg #60.   The history is provided by the patient and medical records.    Past Medical History  Diagnosis Date  . Atrial fibrillation   . Hypertension   . Back pain   . GERD (gastroesophageal reflux disease)   . Thyroid disease   . Kidney stone   . Cirrhosis of liver Sep. 2014  . Carpal tunnel syndrome   . Teeth missing due to caries   . Sciatica   . Diabetes mellitus without complication    Past Surgical History  Procedure Laterality Date  . Cesarean section    . Tonsillectomy    . Appendectomy    . Breast surgery    . Thyroidectomy    . Cesarean section    . Tubal ligation    . Cholecystectomy  Sep. 2014   No family history on  file. History  Substance Use Topics  . Smoking status: Never Smoker   . Smokeless tobacco: Not on file  . Alcohol Use: No   OB History    No data available     Review of Systems  Constitutional: Negative for fever and unexpected weight change.  Gastrointestinal: Negative for constipation.       Negative for fecal incontinence.   Genitourinary: Negative for dysuria, hematuria, flank pain, vaginal bleeding, vaginal discharge and pelvic pain.       Negative for urinary incontinence or retention.  Musculoskeletal: Positive for back pain. Negative for gait problem.  Neurological: Negative for weakness. Numbness: decreased sensation.       Denies saddle paresthesias.    Allergies  Motrin and Ultram  Home Medications   Prior to Admission medications   Medication Sig Start Date End Date Taking? Authorizing Provider  cyclobenzaprine (FLEXERIL) 10 MG tablet Take 10 mg by mouth 3 (three) times daily as needed. For muscle spasms     Historical Provider, MD  diazepam (VALIUM) 5 MG tablet Take 1 tablet (5 mg total) by mouth every 8 (eight) hours as needed for muscle spasms. 04/23/14   Evelina Bucy, MD  digoxin (LANOXIN) 0.25 MG tablet Take 250 mcg by mouth daily.      Historical Provider, MD  esomeprazole (NEXIUM) 40 MG capsule Take 40 mg by mouth daily before breakfast.      Historical Provider, MD  HYDROcodone-acetaminophen (  NORCO) 5-325 MG per tablet Take 1 tablet by mouth every 6 (six) hours as needed for moderate pain. 07/20/14   Pamella Pert, MD  HYDROcodone-acetaminophen (NORCO/VICODIN) 5-325 MG per tablet Take 1 tablet by mouth every 6 (six) hours as needed. 10/29/13   Charles B. Karle Starch, MD  HYDROcodone-acetaminophen (NORCO/VICODIN) 5-325 MG per tablet Take 2 tablets by mouth every 4 (four) hours as needed. 05/27/14   Fransico Meadow, PA-C  levothyroxine (SYNTHROID, LEVOTHROID) 200 MCG tablet Take 200 mcg by mouth daily.    Historical Provider, MD  lisinopril (PRINIVIL,ZESTRIL) 5 MG  tablet Take 5 mg by mouth daily.    Historical Provider, MD  metoprolol (TOPROL-XL) 50 MG 24 hr tablet Take 50 mg by mouth 2 (two) times daily.      Historical Provider, MD  oxyCODONE-acetaminophen (PERCOCET) 5-325 MG per tablet Take 1 tablet by mouth every 6 (six) hours as needed for moderate pain. 04/23/14   Evelina Bucy, MD  pregabalin (LYRICA) 100 MG capsule Take 100 mg by mouth 2 (two) times daily.    Historical Provider, MD   BP 126/54 mmHg  Pulse 63  Temp(Src) 98.6 F (37 C) (Oral)  Resp 16  Ht 4\' 11"  (1.499 m)  Wt 212 lb (96.163 kg)  BMI 42.80 kg/m2  SpO2 99%   Physical Exam  Constitutional: She appears well-developed and well-nourished.  HENT:  Head: Normocephalic and atraumatic.  Eyes: Conjunctivae are normal.  Neck: Normal range of motion. Neck supple.  Pulmonary/Chest: Effort normal.  Abdominal: Soft. There is no tenderness. There is no CVA tenderness.  Musculoskeletal: Normal range of motion.       Cervical back: She exhibits normal range of motion, no tenderness and no bony tenderness.       Thoracic back: She exhibits normal range of motion, no tenderness and no bony tenderness.       Lumbar back: She exhibits tenderness. She exhibits normal range of motion and no bony tenderness.  No step-off noted with palpation of spine.   Neurological: She is alert. She has normal strength and normal reflexes. No sensory deficit.  5/5 strength in entire lower extremities bilaterally. States decreased sensation L foot however sensation is present.   Skin: Skin is warm and dry. No rash noted.  Psychiatric: She has a normal mood and affect.  Nursing note and vitals reviewed.   ED Course  Procedures (including critical care time) Labs Review Labs Reviewed  URINALYSIS, ROUTINE W REFLEX MICROSCOPIC - Abnormal; Notable for the following:    Color, Urine AMBER (*)    Hgb urine dipstick TRACE (*)    Bilirubin Urine SMALL (*)    Urobilinogen, UA >8.0 (*)    All other components  within normal limits  URINE MICROSCOPIC-ADD ON - Abnormal; Notable for the following:    Squamous Epithelial / LPF FEW (*)    All other components within normal limits    Imaging Review No results found.   EKG Interpretation None       4:48 PM Patient not in room x 2.   5:34 PM Patient seen and examined. Will give PO dose flexeril here. Otherwise patient will use her home medications and will follow up with her orthopedist as planned.   Vital signs reviewed and are as follows: BP 126/54 mmHg  Pulse 63  Temp(Src) 98.6 F (37 C) (Oral)  Resp 16  Ht 4\' 11"  (1.499 m)  Wt 212 lb (96.163 kg)  BMI 42.80 kg/m2  SpO2 99%  No  red flag s/s of low back pain. Patient was counseled on back pain precautions and told to do activity as tolerated but do not lift, push, or pull heavy objects more than 10 pounds for the next week.  Patient counseled to use ice or heat on back for no longer than 15 minutes every hour.   Patient urged to follow-up with PCP if pain does not improve with treatment and rest or if pain becomes recurrent. Urged to return with worsening severe pain, loss of bowel or bladder control, trouble walking.   The patient verbalizes understanding and agrees with the plan.   MDM   Final diagnoses:  Chronic back pain   Patient with chronic back pain. No significant neurological deficits. Patient is ambulatory. No warning symptoms of back pain including: fecal incontinence, urinary retention or overflow incontinence, night sweats, waking from sleep with back pain, unexplained fevers or weight loss, h/o cancer, IVDU, recent trauma. No concern for cauda equina, epidural abscess, or other serious cause of back pain. Conservative measures such as rest, ice/heat and home pain medicine indicated with PCP follow-up if no improvement with conservative management.   Concern for inappropriate use of narcotic pain medications per above.     Carlisle Cater, PA-C 08/18/14 [place], MD 08/18/14 1843

## 2014-08-18 NOTE — ED Notes (Signed)
Pt reports hx of sciatic nerve pain, having same sensation in L buttock down to leg area.  Denies n/t in leg or loss of bowel/bladder.  No injury or heavy lifting recently.

## 2014-09-17 ENCOUNTER — Emergency Department (HOSPITAL_BASED_OUTPATIENT_CLINIC_OR_DEPARTMENT_OTHER)
Admission: EM | Admit: 2014-09-17 | Discharge: 2014-09-17 | Disposition: A | Discharge status: Home / Self Care | Payer: Medicaid Other | Attending: Emergency Medicine | Admitting: Emergency Medicine

## 2014-09-17 ENCOUNTER — Encounter (HOSPITAL_BASED_OUTPATIENT_CLINIC_OR_DEPARTMENT_OTHER): Payer: Self-pay

## 2014-09-17 DIAGNOSIS — Z8669 Personal history of other diseases of the nervous system and sense organs: Secondary | ICD-10-CM | POA: Diagnosis not present

## 2014-09-17 DIAGNOSIS — Z965 Presence of tooth-root and mandibular implants: Secondary | ICD-10-CM | POA: Insufficient documentation

## 2014-09-17 DIAGNOSIS — Z79899 Other long term (current) drug therapy: Secondary | ICD-10-CM | POA: Insufficient documentation

## 2014-09-17 DIAGNOSIS — T85848A Pain due to other internal prosthetic devices, implants and grafts, initial encounter: Secondary | ICD-10-CM

## 2014-09-17 DIAGNOSIS — Z8739 Personal history of other diseases of the musculoskeletal system and connective tissue: Secondary | ICD-10-CM | POA: Diagnosis not present

## 2014-09-17 DIAGNOSIS — E079 Disorder of thyroid, unspecified: Secondary | ICD-10-CM | POA: Diagnosis not present

## 2014-09-17 DIAGNOSIS — I10 Essential (primary) hypertension: Secondary | ICD-10-CM | POA: Insufficient documentation

## 2014-09-17 DIAGNOSIS — E119 Type 2 diabetes mellitus without complications: Secondary | ICD-10-CM | POA: Insufficient documentation

## 2014-09-17 DIAGNOSIS — K219 Gastro-esophageal reflux disease without esophagitis: Secondary | ICD-10-CM | POA: Diagnosis not present

## 2014-09-17 DIAGNOSIS — K088 Other specified disorders of teeth and supporting structures: Secondary | ICD-10-CM | POA: Insufficient documentation

## 2014-09-17 DIAGNOSIS — Z87442 Personal history of urinary calculi: Secondary | ICD-10-CM | POA: Insufficient documentation

## 2014-09-17 MED ORDER — PENICILLIN V POTASSIUM 500 MG PO TABS: 500.0000 mg | ORAL_TABLET | Freq: Four times a day (QID) | ORAL | Status: AC

## 2014-09-17 NOTE — ED Provider Notes (Signed)
CSN: [id number]     Arrival date & time 09/17/14  1628 History   First MD Initiated Contact with Patient 09/17/14 1821     Chief Complaint  Patient presents with  . Dental Pain    Lori Case is a 54 y.o. female who presents to the emergency department complaining of one day of front dental pain. The patient reports she has had broken teeth for many years in the front of her mouth. She reports she is having dental pain since last night. Patient rates her pain at 7 out of 10 and worse with wind or cold liquids. Patient reports she is drinking liquids well. The patient is on chronic pain medicines, Zohydro ER, for chronic back pain. Patient reports she's also attempted using Orajel and Tylenol without relief. The patient denies drainage from her mouth. The patient reports some nausea with increased pain but none currently. The patient denies vomiting. The patient denies fevers, chills, vomiting, diarrhea, numbness, tingling, weakness or drainage from her mouth.  (Consider location/radiation/quality/duration/timing/severity/associated sxs/prior Treatment) HPI  Past Medical History  Diagnosis Date  . Atrial fibrillation   . Hypertension   . Back pain   . GERD (gastroesophageal reflux disease)   . Thyroid disease   . Kidney stone   . Cirrhosis of liver Sep. 2014  . Carpal tunnel syndrome   . Teeth missing due to caries   . Sciatica   . Diabetes mellitus without complication    Past Surgical History  Procedure Laterality Date  . Cesarean section    . Tonsillectomy    . Appendectomy    . Breast surgery    . Thyroidectomy    . Cesarean section    . Tubal ligation    . Cholecystectomy  Sep. 2014   No family history on file. History  Substance Use Topics  . Smoking status: Never Smoker   . Smokeless tobacco: Not on file  . Alcohol Use: No   OB History    No data available     Review of Systems  Constitutional: Negative for fever, chills and appetite change.  HENT: Positive  for dental problem. Negative for congestion, ear pain, facial swelling, sore throat and trouble swallowing.   Eyes: Negative for pain and visual disturbance.  Respiratory: Negative for cough, shortness of breath and wheezing.   Cardiovascular: Negative for chest pain and palpitations.  Gastrointestinal: Positive for nausea. Negative for vomiting, abdominal pain and diarrhea.  Genitourinary: Negative for dysuria.  Musculoskeletal: Negative for neck pain and neck stiffness.  Skin: Negative for rash and wound.  Neurological: Negative for dizziness, facial asymmetry, weakness, light-headedness, numbness and headaches.  All other systems reviewed and are negative.     Allergies  Motrin and Ultram  Home Medications   Prior to Admission medications   Medication Sig Start Date End Date Taking? Authorizing Provider  diazepam (VALIUM) 5 MG tablet Take 1 tablet (5 mg total) by mouth every 8 (eight) hours as needed for muscle spasms. 04/23/14   Evelina Bucy, MD  digoxin (LANOXIN) 0.25 MG tablet Take 250 mcg by mouth daily.      Historical Provider, MD  esomeprazole (NEXIUM) 40 MG capsule Take 40 mg by mouth daily before breakfast.      Historical Provider, MD  HYDROcodone-acetaminophen (NORCO/VICODIN) 5-325 MG per tablet Take 1 tablet by mouth every 6 (six) hours as needed. 10/29/13   Charles B. Karle Starch, MD  levothyroxine (SYNTHROID, LEVOTHROID) 200 MCG tablet Take 200 mcg by mouth daily.  Historical Provider, MD  lisinopril (PRINIVIL,ZESTRIL) 5 MG tablet Take 5 mg by mouth daily.    Historical Provider, MD  metoprolol (TOPROL-XL) 50 MG 24 hr tablet Take 50 mg by mouth 2 (two) times daily.      Historical Provider, MD  oxyCODONE-acetaminophen (PERCOCET) 5-325 MG per tablet Take 1 tablet by mouth every 6 (six) hours as needed for moderate pain. 04/23/14   Evelina Bucy, MD  penicillin v potassium (VEETID) 500 MG tablet Take 1 tablet (500 mg total) by mouth 4 (four) times daily. 09/17/14   Verda Cumins  Khalen Styer, PA-C  pregabalin (LYRICA) 100 MG capsule Take 100 mg by mouth 2 (two) times daily.    Historical Provider, MD   BP 141/72 mmHg  Pulse 102  Temp(Src) 98.7 F (37.1 C) (Oral)  Resp 18  Wt 210 lb (95.255 kg)  SpO2 97% Physical Exam  Constitutional: She is oriented to person, place, and time. She appears well-developed and well-nourished. No distress.  HENT:  Head: Normocephalic and atraumatic.  Right Ear: External ear normal.  Left Ear: External ear normal.  Nose: Nose normal.  Mouth/Throat: Oropharynx is clear and moist. No oropharyngeal exudate.    Patient's front 2 teeth are cracked. There is no evidence of abscess, induration or fluctuance. There is no drainage noted. Patient's uvula is midline without edema. Patient's soft palate rises symmetrically. Bilateral tympanic membranes are pearly gray without erythema or loss of landmarks. There is no temporal tenderness. No facial swelling noted.  Eyes: Conjunctivae and EOM are normal. Pupils are equal, round, and reactive to light. Right eye exhibits no discharge. Left eye exhibits no discharge.  Neck: Normal range of motion. Neck supple.  There is no cervical or submandibular lymphadenopathy.  Cardiovascular: Normal rate, regular rhythm, normal heart sounds and intact distal pulses.  Exam reveals no gallop and no friction rub.   No murmur heard. HR 92  Pulmonary/Chest: Effort normal and breath sounds normal. No respiratory distress. She has no wheezes. She has no rales.  Abdominal: Soft. There is no tenderness.  Musculoskeletal: She exhibits no edema.  Lymphadenopathy:    She has no cervical adenopathy.  Neurological: She is alert and oriented to person, place, and time. No cranial nerve deficit. Coordination normal.  Cranial nerves II through XII are intact bilaterally. Patient has good sensation in her bilateral face.  Skin: Skin is warm and dry. No rash noted. She is not diaphoretic. No erythema. No pallor.  Psychiatric:  She has a normal mood and affect. Her behavior is normal.  Nursing note and vitals reviewed.   ED Course  Procedures (including critical care time) Labs Review Labs Reviewed - No data to display  Imaging Review No results found.   EKG Interpretation None      Filed Vitals:   09/17/14 1635  BP: 141/72  Pulse: 102  Temp: 98.7 F (37.1 C)  TempSrc: Oral  Resp: 18  Weight: 210 lb (95.255 kg)  SpO2: 97%     MDM   Final diagnoses:  Dental implant pain, initial encounter   Patient presented to the ED complaining of one day of front dental pain. The patient has had cracked front teeth for many years. The patient denies fevers, drainage or difficulty drinking. The patient's cranial nerves II through XII are intact bilaterally. There is no evidence of abscess or induration. Patient's neck is soft and there is no evidence of lymphadenopathy. The patient is on chronic pain medicine for chronic back pain. The patient is  afebrile and nontoxic appearing. We'll discharge this patient with information for follow-up with the on-call Dentist Dr. Junita Push. I advised patient she needs to call and make an appointment within the first 48 hours of being seen. Advise patient to take her discharge instructions to her dental appointment.  This patient was looked up on the [place] Controlled Substance reporting system. The patient received a 30 day supply of Zohydro ER on 09/01/14. I advised patient she needs to take her chronic pain medicine for her continued pain. The patient verbalized understanding and agreement with this plan. I advised patient to return to the emergency department with new or worsening symptoms or new concerns. The patient verbalized understanding and agreement with plan.     Hanley Hays, PA-C 09/17/14 [place], MD 09/21/14 1322

## 2014-09-17 NOTE — Discharge Instructions (Signed)

## 2014-09-17 NOTE — ED Notes (Signed)
Patient here with 2 front broken teeth with pain x 1 day. Reports broken for a while but the pain increasing
